# Patient Record
Sex: Male | Born: 1935 | Smoking: Never smoker
Health system: Southern US, Community
[De-identification: ages and names within clinical notes are randomized; demographics above are authoritative.]

## PROBLEM LIST (undated history)

## (undated) DIAGNOSIS — I10 Essential (primary) hypertension: Secondary | ICD-10-CM

## (undated) DIAGNOSIS — F32A Depression, unspecified: Secondary | ICD-10-CM

## (undated) DIAGNOSIS — F329 Major depressive disorder, single episode, unspecified: Secondary | ICD-10-CM

## (undated) DIAGNOSIS — M1990 Unspecified osteoarthritis, unspecified site: Secondary | ICD-10-CM

## (undated) DIAGNOSIS — F0390 Unspecified dementia without behavioral disturbance: Secondary | ICD-10-CM

## (undated) HISTORY — DX: Depression, unspecified: F32.A

## (undated) HISTORY — DX: Unspecified osteoarthritis, unspecified site: M19.90

## (undated) HISTORY — DX: Unspecified dementia, unspecified severity, without behavioral disturbance, psychotic disturbance, mood disturbance, and anxiety: F03.90

## (undated) HISTORY — DX: Essential (primary) hypertension: I10

---
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
## 2019-12-09 NOTE — Progress Notes (Signed)
May 6th, 2021 [facility] Palliative Care Consult Note Telephone: [phone]  Fax: [phone]  PATIENT NAME: [name] Sr. DOB: 02-04-1925 MRN: [id number]  [facility] [address] (move in date 12/10/20200) [address]  [place] [address]  PRIMARY CARE PROVIDER: Georges Lynch, NP ([facility])  REFERRING PROVIDER: Georges Lynch, NP  RESPONSIBLE PARTY:  [name] (Daughter) [email or web address] [phone] (Mobile). Troy, Schmidt (son/Alexandra [place]) [phone] [email or web address]   ASSESSMENT / RECOMMENDATIONS:  1. Advance Care Planning: A. Directives: DNR on facility chart and uploaded to [facility] EMR B. Goals of Care: keep happy and content where he is. Always wanted to have fun and be silly. Seems to be enjoying his life at [facility].  2. Cognitive / Functional status: A & O to self only. Non-ambulatory. Tends to become loud early evening but calms after evening meds. Followed by Troy Braun NP (Psych) for dementia (Aricept for cognition, Seroquel bid for related behaviors), mild depression (Celexa), and insomnia (Melatonin)1-2 transfer to wheelchair. Incontinent of bowel and bladder.   Dependent for hygiene and dressing. Clip alarm on wheelchair for safety. Falls out of wheelchair about q mo; last eve out of wheelchair d/t another resident pulling out his wheelchair.   Able to feed himself. Last weight 1 month earlier was 164.4lbs, a gain of 10.4lbs over the last 4 months. At a Height of 5'6" his BMI is 26.5kg/m2. Consumes 75-100% of 3 meals/d.  3. Family Supports: Widowed Licensed conveyancer). Served in the [employer]. I spoke with daughter [name] with visit updates.  4. Follow up Palliative Care Visit: in 1-2 months and prn.  I spent 60 minutes providing this consultation from 1-2pm to 10am. More than 50% of the time in this consultation was spent coordinating communication.   HISTORY OF PRESENT ILLNESS:   [name] Sr. is a 86 y.o. male with h/o dementia, DJD, and HTN.  Palliative Care was asked to help address goals of care.   CODE STATUS: DNR  PPS: 30% HOSPICE ELIGIBILITY/DIAGNOSIS: TBD  PAST MEDICAL HISTORY:  Past Medical History:  Diagnosis Date  . Dementia (HCC)   . Depression   . DJD (degenerative joint disease)   . HTN (hypertension)     SOCIAL HX:  Social History   Tobacco Use  . Smoking status: Not on file  Substance Use Topics  . Alcohol use: Not on file    ALLERGIES: Not on File   PERTINENT MEDICATIONS:  Outpatient Encounter Medications as of 12/10/2019  Medication Sig  . acetaminophen (TYLENOL) 500 MG tablet Take 500 mg by mouth every 6 (six) hours as needed.  . carvedilol (COREG) 6.25 MG tablet Take 6.25 mg by mouth 2 (two) times daily.  . Cholecalciferol (VITAMIN D-3) 125 MCG (5000 UT) TABS Take by mouth. 1 tab qd  . citalopram (CELEXA) 10 MG tablet Take 10 mg by mouth daily.  Marland Kitchen donepezil (ARICEPT) 10 MG tablet Take 10 mg by mouth at bedtime.  . hydrochlorothiazide (HYDRODIURIL) 12.5 MG tablet Take 12.5 mg by mouth daily.  Marland Kitchen ipratropium (ATROVENT) 0.03 % nasal spray Place 2 sprays into both nostrils every 12 (twelve) hours.  . melatonin 3 MG TABS tablet Take 3 mg by mouth at bedtime.  Marland Kitchen QUEtiapine (SEROQUEL) 25 MG tablet Take 25 mg by mouth at bedtime. 1/2 tab (12.5mg ) qam and 1 tab (25mg ) qhs  . ramipril (ALTACE) 10 MG capsule Take 10 mg by mouth daily.  . simvastatin (ZOCOR) 10 MG tablet  Take 10 mg by mouth daily.  . Skin Protectants, Misc. (BAZA PROTECT EX) Apply topically. Apply to buttocks bid  . tamsulosin (FLOMAX) 0.4 MG CAPS capsule Take 0.4 mg by mouth.  . vitamin B-12 (CYANOCOBALAMIN) 1000 MCG tablet Take 1,000 mcg by mouth daily.   No facility-administered encounter medications on file as of 12/10/2019.    PHYSICAL EXAM:   Slender, affable, oriented to self only. Conversant, but speech tangential and often off topic. Engaged and engaging.  Maintaining good eye-contact. When asked where he was, read off of a sign in the room some informational material. Cardiovascular: regular rate and rhythm Pulmonary: clear ant fields Abdomen: soft, nontender, + bowel sounds Extremities: bilateral LE edema (ted hose in place), knuckle joint deformities of arthritis Skin: no rashes Neurological: Weakness but otherwise non-focal  Troy Handler, NP

## 2019-12-10 ENCOUNTER — Encounter: Payer: Self-pay | Admitting: Internal Medicine

## 2019-12-10 ENCOUNTER — Non-Acute Institutional Stay: Payer: Medicare Other | Admitting: Internal Medicine

## 2019-12-10 ENCOUNTER — Other Ambulatory Visit: Payer: Self-pay

## 2019-12-10 VITALS — Ht 66.0 in | Wt 164.4 lb

## 2019-12-10 DIAGNOSIS — Z515 Encounter for palliative care: Secondary | ICD-10-CM

## 2019-12-10 DIAGNOSIS — Z7189 Other specified counseling: Secondary | ICD-10-CM

## 2020-02-07 ENCOUNTER — Non-Acute Institutional Stay: Payer: Medicare Other | Admitting: Internal Medicine

## 2020-02-07 ENCOUNTER — Other Ambulatory Visit: Payer: Self-pay

## 2020-02-07 ENCOUNTER — Encounter: Payer: Self-pay | Admitting: Internal Medicine

## 2020-02-07 DIAGNOSIS — Z515 Encounter for palliative care: Secondary | ICD-10-CM

## 2020-02-07 DIAGNOSIS — Z7189 Other specified counseling: Secondary | ICD-10-CM

## 2020-02-07 NOTE — Progress Notes (Addendum)
July 4th, 2021 [facility] Palliative Care Consult Note Telephone: [phone]  Fax: [phone]   PATIENT NAME: Troy STEFFENHAGEN Sr. DOB: Jan 18, 1925 MRN: [id number]  [facility] [address] (move in date 12/10/20200)  [address]  [place] [address]  PRIMARY CARE PROVIDER: Georges Lynch, NP ([facility])   REFERRING PROVIDER: Georges Lynch, NP   RESPONSIBLE PARTY:  Caprice Renshaw (Daughter) [email or web address] [phone] (Mobile). Carmel, Waddington (son/Alexandra [place]) [phone] [email or web address]   ASSESSMENT / RECOMMENDATIONS:  1. Advance Care Planning: A. Directives: DNR on facility chart and uploaded to [facility] EMR B. Goals of Care: Continues happy and content. keep happy and content where he is. Pleasant affect, family shares he always wanted to have fun and be silly. Seems to continue to be enjoying his life at [facility].   2. Cognitive / Functional status: A & O to self only. Non-ambulatory. Staff report continues shouting out in the evenings. Mornings okay though can be resistant to care. He receives a late morning dose of Seroquel, and a larger bedtime dose, which seems to help behavious. Followed by Clydie Braun NP (Psych) for dementia (Aricept for cognition, Seroquel bid for related behaviors), mild depression (Celexa), and insomnia (Melatonin).1-2 transfer to wheelchair. Incontinent of bowel and bladder.    -consider additional dose midafternoon (4pm) dose Melatonin or additional Seroquel 25 mg. Chiropodist will run this by the psych NP, who is rounding tomorrow.  Dependent for hygiene and dressing. Clip alarm on wheelchair for safety. No recent falls.    Able to feed himself. Staff report episode strong/strangled cough last week while eating, concerning for aspiration. Speech therapy consulted and recommended change to mechanical soft diet which he is tolerating. No problems with chocking on food since. No  cough, dyspnea, or fever concerning for aspiration. I was able to see him feed himself lunch independently. He had one strong clearing cough, otherwise without problems. Weight book currently unavailable, but staff report good appetite and no signs of weight loss.  Last weight I have for him 2 months earlier was 164.4lbs, which was a gain of 10.4lbs over the prior 4 months. At a Height of 5'6" his BMI was 26.5kg/m2. Consumes 75-100% of 3 meals/d.   3. Family Supports: Widowed Licensed conveyancer). Served in the [employer]. I left a message on the cell phone of daughter Rayfield Citizen with my contact information, and invited her to call me for visit updates.   4. Follow up Palliative Care Visit: in 1-2 months and prn.   I spent 60 minutes providing this consultation from 10am to 11am. More than 50% of the time in this consultation was spent coordinating communication.    HISTORY OF PRESENT ILLNESS:  Troy LIFORD Sr. is a 86 y.o. male with h/o dementia, DJD, and HTN.   This is a f/u Palliative Care visit from May 6th, 2021.    CODE STATUS: DNR   PPS: 30%  HOSPICE ELIGIBILITY/DIAGNOSIS: TBD PAST MEDICAL HISTORY:  Past Medical History:  Diagnosis Date  . Dementia (HCC)   . Depression   . DJD (degenerative joint disease)   . HTN (hypertension)     SOCIAL HX:  Social History   Tobacco Use  . Smoking status: Not on file  Substance Use Topics  . Alcohol use: Not on file    ALLERGIES: No Known Allergies   PERTINENT MEDICATIONS:  Outpatient Encounter Medications as of 02/07/2020  Medication Sig  . acetaminophen (TYLENOL) 500 MG tablet  Take 500 mg by mouth every 6 (six) hours as needed.  . carvedilol (COREG) 6.25 MG tablet Take 6.25 mg by mouth 2 (two) times daily.  . Cholecalciferol (VITAMIN D-3) 125 MCG (5000 UT) TABS Take by mouth. 1 tab qd  . citalopram (CELEXA) 10 MG tablet Take 10 mg by mouth daily.  Marland Kitchen donepezil (ARICEPT) 10 MG tablet Take 10 mg by mouth at bedtime.  . hydrochlorothiazide  (HYDRODIURIL) 12.5 MG tablet Take 12.5 mg by mouth daily.  Marland Kitchen ipratropium (ATROVENT) 0.03 % nasal spray Place 2 sprays into both nostrils every 12 (twelve) hours.  . melatonin 3 MG TABS tablet Take 3 mg by mouth at bedtime.  Marland Kitchen QUEtiapine (SEROQUEL) 25 MG tablet Take 25 mg by mouth at bedtime. 1/2 tab (12.5mg ) qam and 1 tab (25mg ) qhs  . ramipril (ALTACE) 10 MG capsule Take 10 mg by mouth daily.  . simvastatin (ZOCOR) 10 MG tablet Take 10 mg by mouth daily.  . Skin Protectants, Misc. (BAZA PROTECT EX) Apply topically. Apply to buttocks bid  . tamsulosin (FLOMAX) 0.4 MG CAPS capsule Take 0.4 mg by mouth.  . vitamin B-12 (CYANOCOBALAMIN) 1000 MCG tablet Take 1,000 mcg by mouth daily.   No facility-administered encounter medications on file as of 02/07/2020.    PHYSICAL EXAM:   99% Room Air, HR 86. RR 12 Slender, affable, oriented to self only. Conversant, without any concerns. Engaged and engaging. Able to maintain eyecontact Cardiovascular: regular rate and rhythm. Atrial gallop. Systolic murmur at apex. Pulmonary: clear posterior fields Abdomen: soft, nontender, + bowel sounds Extremities: bilateral LE edema (ted hose in place), knuckle joint deformities of arthritis Skin: no rashes Neurological: Weakness but otherwise non-focal. Wheelchair confined.  04/09/2020, NP

## 2020-04-27 ENCOUNTER — Other Ambulatory Visit: Payer: Self-pay

## 2020-04-27 ENCOUNTER — Non-Acute Institutional Stay: Payer: Medicare Other | Admitting: Nurse Practitioner

## 2020-04-27 DIAGNOSIS — Z515 Encounter for palliative care: Secondary | ICD-10-CM

## 2020-04-27 DIAGNOSIS — F0391 Unspecified dementia with behavioral disturbance: Secondary | ICD-10-CM

## 2020-04-27 NOTE — Progress Notes (Addendum)
[facility] Consult Note Telephone: [phone]  Fax: [phone]  Addendum 03/28/2020 Telephone call made to [place]. Daughter Chrys Racer expressed concern for patient's constant sleepiness. She expressed concern for possible over medication stating that patient medication was adjusted 3-4 months ago. She report patient used to be more interactive and participates in facility activities. She said in recent weeks, patient is always noted to be hunched-over in wheelchair sleeping and not have much of an interaction whenever she is visiting.  She also expressed concerns for patient regimen related to patient's pain management due osteoarthritis, she report that patient used to receive steroid injection to bilateral knee joint in the past. She asked if his pain medication can be scheduled instead of having it as needed, saying patient may not know ot ask for it due to his dementia. Discussed family concerns with [facility] facility resident care coordinator for memory care unit. Legrand Como said situation will be addressed by Psych NP and facility provider. Recommended scheduling patient on Tylenol 628m BID.    PATIENT NAME: Troy DAHLENSr. [address] [place] [phone](home)  DOB: 84-Oct-1926MRN: [id number] PRIMARY CARE PROVIDER:    NJacelyn Pi NP,  [address] [place] [phone] REFERRING PROVIDER:   [place]Jacelyn Pi [place]  [place] [phone]  RESPONSIBLE PARTY:  CLanna Poche(Daughter) [email or web address] [phone](Mobile). RCresencio, Reesor(son[place] [email or web address]     I met face to face with patient in facility.  ASSESSMENT AND RECOMMENDATIONS:   1. Advance Care Planning/Goals of Care:  Directives: DNR on facility chart and on [facility]. Goals of Care: Goals  of care is comfort, nothing aggressive. Per family, patient has always been healthy, family want him to be content, have some enjoyment if her can. Family do not want him in pain,  want him happy and content where he is. Family report that patient is easy going, and happy person used to like to have fun.  2. Symptom Management: Patient with advanced dementia with behavioral concerns, he currently resides in a locked unit of an assisted living. Patient behavior appeared controlled on current regimen of Seroquel 12.572mmorning and noon and 257mn the evenings. Patient alert and cooperative during visit today, no evidence of acute illness observed. Staff report good appetite, weight is stable. Palliative care will continue to provided support to patient, family and medical team. However  due to patient's complex disease issues patient is at increased risk for skin breakdown with infection, gradual weight loss, dysphagia, aspiration, vascular compromise leading to CVA, MI, death. These risk will remain and will progress over time due to advancing age. Will reach out to family to discuss visit findings.  3. Follow up Palliative Care Visit: Palliative care will continue to follow for goals of care clarification and symptom management. Return 8 weeks or prn.  4. Family /Caregiver/Community Supports: Served in the [employer] is widowed. Family very involved in his care.  5. Cognitive / Functional decline: Patient dependent on staff for all of his ADLs, able to feed self. Diet down graded to mechanical soft, tolerating diet. He is followed by mental health provider, Wheelchair dependent, 1-2 person assistance with transfer. Patient has clip alarm on wheelchair for safety.  I spent 48 minutes providing this consultation, time includes time spent with patient/family, chart review, provider coordination, and  documentation. More than 50% of the time in this consultation was spent coordinating communication.    HISTORY OF PRESENT ILLNESS:  Reino Lybbert. is a 86 y.o. year old male with multiple medical problems including dementia, DJD, and HTN. Palliative Care was asked to follow this patient by consultation request of Jacelyn Pi, NP to help address advance care planning and goals of care. This is a follow up visit, last visit was on 02/07/2020  CODE STATUS: DNR  PPS: 30%  HOSPICE ELIGIBILITY/DIAGNOSIS: TBD  PAST MEDICAL HISTORY:  Past Medical History:  Diagnosis Date   Dementia ([facility])    Depression    DJD (degenerative joint disease)    HTN (hypertension)     SOCIAL HX:  Social History   Tobacco Use   Smoking status: Not on file  Substance Use Topics   Alcohol use: Not on file   FAMILY HX: No family history on file.  ALLERGIES: No Known Allergies   PERTINENT MEDICATIONS:  Outpatient Encounter Medications as of 04/27/2020  Medication Sig   acetaminophen (TYLENOL) 500 MG tablet Take 500 mg by mouth every 6 (six) hours as needed.   carvedilol (COREG) 6.25 MG tablet Take 6.25 mg by mouth 2 (two) times daily.   Cholecalciferol (VITAMIN D-3) 125 MCG (5000 UT) TABS Take by mouth. 1 tab qd   citalopram (CELEXA) 10 MG tablet Take 10 mg by mouth daily.   donepezil (ARICEPT) 10 MG tablet Take 10 mg by mouth at bedtime.   hydrochlorothiazide (HYDRODIURIL) 12.5 MG tablet Take 12.5 mg by mouth daily.   ipratropium (ATROVENT) 0.03 % nasal spray Place 2 sprays into both nostrils every 12 (twelve) hours.   melatonin 3 MG TABS tablet Take 3 mg by mouth at bedtime.   QUEtiapine (SEROQUEL) 25 MG tablet Take 25 mg by mouth at bedtime. 1/2 tab (12.30m) qam and 1 tab (290m qhs   ramipril (ALTACE) 10 MG capsule Take 10 mg by mouth daily.   simvastatin (ZOCOR) 10 MG tablet Take 10 mg by mouth daily.   Skin Protectants, Misc. (BAZA PROTECT EX) Apply topically. Apply to buttocks bid   tamsulosin (FLOMAX) 0.4 MG CAPS capsule Take 0.4 mg by mouth.   vitamin B-12  (CYANOCOBALAMIN) 1000 MCG tablet Take 1,000 mcg by mouth daily.   No facility-administered encounter medications on file as of 04/27/2020.    PHYSICAL EXAM / ROS:   Current and past weights: 163.1lbs stable from last visit. Ht 30f2f, BMI 26.5kg/m2 General:  frail appearing, sitting in wheelchair watching TV in living area, NAD Cardiovascular: denied chest pain reported, S1S2 normal Pulmonary: no cough, no increased SOB, room air Abdomen: appetite fair, no report of constipation, incontinent of bowel GU: denies dysuria, incontinent of urine MSK:  no joint and ROM abnormalities, wheelchair dependent Skin: no rashes, broken nail to right fore-finger-site tender to touch, no drainage Neurological: Weakness, but otherwise non-focal   QueJari FavreNP, AGPCNP-BC

## 2020-07-10 ENCOUNTER — Non-Acute Institutional Stay: Payer: Medicare Other | Admitting: Nurse Practitioner

## 2020-07-10 ENCOUNTER — Other Ambulatory Visit: Payer: Self-pay

## 2020-07-10 DIAGNOSIS — G309 Alzheimer's disease, unspecified: Secondary | ICD-10-CM

## 2020-07-10 DIAGNOSIS — Z515 Encounter for palliative care: Secondary | ICD-10-CM

## 2020-07-10 DIAGNOSIS — F0281 Dementia in other diseases classified elsewhere with behavioral disturbance: Secondary | ICD-10-CM

## 2020-07-10 NOTE — Progress Notes (Signed)
[facility] Consult Note Telephone: [phone]  Fax: [phone]  PATIENT NAME: Troy MANGEL Sr. [address] [place] [address] [phone] (home)  DOB: [birth year] MRN: [id number]  PRIMARY CARE PROVIDER:    Jacelyn Pi, NP,  [address] [place] [address] [phone]  REFERRING PROVIDER:   Benedict Needy Valere Dross, [address] [place],  [place] [address] [phone]  RESPONSIBLE PARTY:   Extended Emergency Contact Information Primary Emergency Contact: Lanna Poche Mobile Phone: [phone] Relation: Daughter Secondary Emergency Contact: Zeke, Aker Mobile Phone: [phone] Relation: Son  I met face to face with patient in facility.   ASSESSMENT AND RECOMMENDATIONS:   Advance Care Planning: (Per prior palliative care visit note) Goal of care: Goals of care is comfort. Directives: Signed DNR form on chart in facility and on [facility] EMR  Symptom Management:  Weight loss: Patient with 3.5llb weight loss from last palliative care visit 2 months ago, BMI 25.8kg/m2. Patient does not appear acutely ill on exam today, no evidence of acute distress noted. Patient is a poor historian related to poor cognition secondary to advance dementia. Staff voiced no concerns for patient, report fair appetite. No report of recent fall or hospitalization, no report of uncontrolled pain or behavior. Recommendation: BMI within normal range. Continue to monitor weight per facility protocol, may consider dietary supplement like Ensure and /or appetite stimulant Remeron 7.66m if weight loss persist. Encourage snacking between meals.   Follow up Palliative Care Visit: Palliative care will continue to follow for goals of care clarification and symptom management. Return 6-8 weeks or prn.  Family /Caregiver/Community Supports: Patient Served in the [employer] Patient is  widowed. Family very involved in his care. Unable to reach family to discuss visit update, left voice message with my contact information to call me.  Cognitive / Functional decline: Patient dependent on staff for all of his ADLs, able to feed self. On mechanical soft, tolerating diet with no report of evidence of aspiration. He is followed by mental health provider. Patient is wheelchair dependent, 1-2 person assistance with transfers, has clip alarm on wheelchair for safety.  I spent 48 minutes providing this consultation, time includes time spent with patient, chart review, provider coordination, and documentation. More than 50% of the time in this consultation was spent counseling and coordinating communication.   CHIEF COMPLAINT: Follow up palliative care visit  HISTORY OF PRESENT ILLNESS:  Troy CHAVARINSr. is a 86y.o. year old male with multiple medical problems including dementia (FAST 7a), DJD, and HTN. Palliative Care was asked to help address advance care planning and goals of care.   CODE STATUS: DNR  PPS: 30%  HOSPICE ELIGIBILITY/DIAGNOSIS: TBD  PHYSICAL EXAM / ROS:   Current and past weights: 159.6lbs down from 163.1lbs from last visit, Ht 596f", BMI 25.8kg/m2 General: frail appearing, sitting in his wheelchair in NAD Cardiovascular: denied chest pain reported, S1S2 normal Pulmonary: no cough, no increased SOB, room air Abdomen: appetite fair, no report of constipation, incontinent of bowel GU: denies dysuria, incontinent of urine MSK:  no joint and ROM abnormalities, wheelchair dependent Skin: no rashes or wound reported or noted on exposed skin Neurological: weakness, but otherwise non-focal  PAST MEDICAL HISTORY:  Past Medical History:  Diagnosis Date  . Dementia ([facility]  . Depression   . DJD (degenerative joint disease)   . HTN (hypertension)  SOCIAL HX:  Social History   Tobacco Use  . Smoking status: Not on file  Substance Use Topics  . Alcohol  use: Not on file   FAMILY HX: No family history on file.  ALLERGIES: No Known Allergies   PERTINENT MEDICATIONS:  Outpatient Encounter Medications as of 07/10/2020  Medication Sig  . acetaminophen (TYLENOL) 500 MG tablet Take 500 mg by mouth every 6 (six) hours as needed.  . carvedilol (COREG) 6.25 MG tablet Take 6.25 mg by mouth 2 (two) times daily.  . Cholecalciferol (VITAMIN D-3) 125 MCG (5000 UT) TABS Take by mouth. 1 tab qd  . citalopram (CELEXA) 10 MG tablet Take 10 mg by mouth daily.  Marland Kitchen donepezil (ARICEPT) 10 MG tablet Take 10 mg by mouth at bedtime.  . hydrochlorothiazide (HYDRODIURIL) 12.5 MG tablet Take 12.5 mg by mouth daily.  Marland Kitchen ipratropium (ATROVENT) 0.03 % nasal spray Place 2 sprays into both nostrils every 12 (twelve) hours.  . melatonin 3 MG TABS tablet Take 3 mg by mouth at bedtime.  Marland Kitchen QUEtiapine (SEROQUEL) 25 MG tablet Take 25 mg by mouth at bedtime. 1/2 tab (12.59m) qam and 1 tab (273m qhs  . ramipril (ALTACE) 10 MG capsule Take 10 mg by mouth daily.  . simvastatin (ZOCOR) 10 MG tablet Take 10 mg by mouth daily.  . Skin Protectants, Misc. (BAZA PROTECT EX) Apply topically. Apply to buttocks bid  . tamsulosin (FLOMAX) 0.4 MG CAPS capsule Take 0.4 mg by mouth.  . vitamin B-12 (CYANOCOBALAMIN) 1000 MCG tablet Take 1,000 mcg by mouth daily.   No facility-administered encounter medications on file as of 07/10/2020.     QuAlba DestineNP , DNP, AGPCNP-BC

## 2020-11-19 ENCOUNTER — Inpatient Hospital Stay (HOSPITAL_COMMUNITY): Payer: Medicare Other

## 2020-11-19 ENCOUNTER — Emergency Department (HOSPITAL_COMMUNITY): Payer: Medicare Other

## 2020-11-19 ENCOUNTER — Inpatient Hospital Stay (HOSPITAL_COMMUNITY)
Admission: EM | Admit: 2020-11-19 | Discharge: 2020-11-27 | DRG: 684 | Disposition: A | Discharge status: Skilled Nursing Facility | Payer: Medicare Other | Source: Skilled Nursing Facility | Attending: Internal Medicine | Admitting: Internal Medicine

## 2020-11-19 DIAGNOSIS — Z79899 Other long term (current) drug therapy: Secondary | ICD-10-CM

## 2020-11-19 DIAGNOSIS — D638 Anemia in other chronic diseases classified elsewhere: Secondary | ICD-10-CM | POA: Diagnosis present

## 2020-11-19 DIAGNOSIS — I10 Essential (primary) hypertension: Secondary | ICD-10-CM | POA: Diagnosis present

## 2020-11-19 DIAGNOSIS — R338 Other retention of urine: Secondary | ICD-10-CM | POA: Diagnosis present

## 2020-11-19 DIAGNOSIS — K5900 Constipation, unspecified: Secondary | ICD-10-CM | POA: Diagnosis present

## 2020-11-19 DIAGNOSIS — F0280 Dementia in other diseases classified elsewhere without behavioral disturbance: Secondary | ICD-10-CM | POA: Diagnosis present

## 2020-11-19 DIAGNOSIS — F0390 Unspecified dementia without behavioral disturbance: Secondary | ICD-10-CM | POA: Diagnosis present

## 2020-11-19 DIAGNOSIS — R296 Repeated falls: Secondary | ICD-10-CM | POA: Diagnosis present

## 2020-11-19 DIAGNOSIS — R1310 Dysphagia, unspecified: Secondary | ICD-10-CM | POA: Diagnosis present

## 2020-11-19 DIAGNOSIS — R4182 Altered mental status, unspecified: Secondary | ICD-10-CM

## 2020-11-19 DIAGNOSIS — Z66 Do not resuscitate: Secondary | ICD-10-CM | POA: Diagnosis present

## 2020-11-19 DIAGNOSIS — R319 Hematuria, unspecified: Secondary | ICD-10-CM

## 2020-11-19 DIAGNOSIS — R197 Diarrhea, unspecified: Secondary | ICD-10-CM | POA: Diagnosis present

## 2020-11-19 DIAGNOSIS — Z993 Dependence on wheelchair: Secondary | ICD-10-CM | POA: Diagnosis not present

## 2020-11-19 DIAGNOSIS — E875 Hyperkalemia: Secondary | ICD-10-CM | POA: Diagnosis present

## 2020-11-19 DIAGNOSIS — E860 Dehydration: Secondary | ICD-10-CM | POA: Diagnosis present

## 2020-11-19 DIAGNOSIS — Z515 Encounter for palliative care: Secondary | ICD-10-CM | POA: Diagnosis not present

## 2020-11-19 DIAGNOSIS — D72829 Elevated white blood cell count, unspecified: Secondary | ICD-10-CM | POA: Diagnosis not present

## 2020-11-19 DIAGNOSIS — Z20822 Contact with and (suspected) exposure to covid-19: Secondary | ICD-10-CM | POA: Diagnosis present

## 2020-11-19 DIAGNOSIS — E876 Hypokalemia: Secondary | ICD-10-CM | POA: Diagnosis not present

## 2020-11-19 DIAGNOSIS — N401 Enlarged prostate with lower urinary tract symptoms: Secondary | ICD-10-CM | POA: Diagnosis present

## 2020-11-19 DIAGNOSIS — N179 Acute kidney failure, unspecified: Principal | ICD-10-CM | POA: Diagnosis present

## 2020-11-19 DIAGNOSIS — G309 Alzheimer's disease, unspecified: Secondary | ICD-10-CM | POA: Diagnosis present

## 2020-11-19 DIAGNOSIS — Z7189 Other specified counseling: Secondary | ICD-10-CM | POA: Diagnosis not present

## 2020-11-19 DIAGNOSIS — R633 Feeding difficulties, unspecified: Secondary | ICD-10-CM | POA: Diagnosis present

## 2020-11-19 DIAGNOSIS — R34 Anuria and oliguria: Secondary | ICD-10-CM | POA: Diagnosis present

## 2020-11-19 DIAGNOSIS — F32A Depression, unspecified: Secondary | ICD-10-CM | POA: Diagnosis present

## 2020-11-19 LAB — URINALYSIS, ROUTINE W REFLEX MICROSCOPIC
Bilirubin Urine: NEGATIVE
Glucose, UA: NEGATIVE mg/dL
Ketones, ur: NEGATIVE mg/dL
Nitrite: NEGATIVE
Protein, ur: NEGATIVE mg/dL
RBC / HPF: >50 RBC/hpf — ABNORMAL HIGH (ref 0–5)
Specific Gravity, Urine: 1.011 (ref 1.005–1.030)
pH: 5 (ref 5.0–8.0)

## 2020-11-19 LAB — COMPREHENSIVE METABOLIC PANEL
ALT: 7 U/L (ref 0–44)
AST: 26 U/L (ref 15–41)
Albumin: 3.5 g/dL (ref 3.5–5.0)
Alkaline Phosphatase: 45 U/L (ref 38–126)
Anion gap: 11 (ref 5–15)
BUN: 55 mg/dL — ABNORMAL HIGH (ref 8–23)
CO2: 22 mmol/L (ref 22–32)
Calcium: 8.6 mg/dL — ABNORMAL LOW (ref 8.9–10.3)
Chloride: 104 mmol/L (ref 98–111)
Creatinine, Ser: 3.82 mg/dL — ABNORMAL HIGH (ref 0.61–1.24)
GFR, Estimated: 14 mL/min — ABNORMAL LOW (ref >60)
Glucose, Bld: 127 mg/dL — ABNORMAL HIGH (ref 70–99)
Potassium: 5.6 mmol/L — ABNORMAL HIGH (ref 3.5–5.1)
Sodium: 137 mmol/L (ref 135–145)
Total Bilirubin: 1.9 mg/dL — ABNORMAL HIGH (ref 0.3–1.2)
Total Protein: 6.4 g/dL — ABNORMAL LOW (ref 6.5–8.1)

## 2020-11-19 LAB — CBC WITH DIFFERENTIAL/PLATELET
Abs Immature Granulocytes: 0.16 10*3/uL — ABNORMAL HIGH (ref 0.00–0.07)
Basophils Absolute: 0 10*3/uL (ref 0.0–0.1)
Basophils Relative: 0 %
Eosinophils Absolute: 0 10*3/uL (ref 0.0–0.5)
Eosinophils Relative: 0 %
HCT: 38.7 % — ABNORMAL LOW (ref 39.0–52.0)
Hemoglobin: 12.7 g/dL — ABNORMAL LOW (ref 13.0–17.0)
Immature Granulocytes: 1 %
Lymphocytes Relative: 4 %
Lymphs Abs: 0.9 10*3/uL (ref 0.7–4.0)
MCH: 31.3 pg (ref 26.0–34.0)
MCHC: 32.8 g/dL (ref 30.0–36.0)
MCV: 95.3 fL (ref 80.0–100.0)
Monocytes Absolute: 1.5 10*3/uL — ABNORMAL HIGH (ref 0.1–1.0)
Monocytes Relative: 8 %
Neutro Abs: 16.9 10*3/uL — ABNORMAL HIGH (ref 1.7–7.7)
Neutrophils Relative %: 87 %
Platelets: 160 10*3/uL (ref 150–400)
RBC: 4.06 MIL/uL — ABNORMAL LOW (ref 4.22–5.81)
RDW: 14.2 % (ref 11.5–15.5)
WBC: 19.4 10*3/uL — ABNORMAL HIGH (ref 4.0–10.5)
nRBC: 0 % (ref 0.0–0.2)

## 2020-11-19 LAB — RESP PANEL BY RT-PCR (FLU A&B, COVID) ARPGX2
Influenza A by PCR: NEGATIVE
Influenza B by PCR: NEGATIVE
SARS Coronavirus 2 by RT PCR: NEGATIVE

## 2020-11-19 LAB — TROPONIN I (HIGH SENSITIVITY): Troponin I (High Sensitivity): 61 ng/L — ABNORMAL HIGH (ref <18)

## 2020-11-19 LAB — CBG MONITORING, ED: Glucose-Capillary: 129 mg/dL — ABNORMAL HIGH (ref 70–99)

## 2020-11-19 LAB — LIPASE, BLOOD: Lipase: 25 U/L (ref 11–51)

## 2020-11-19 MED ORDER — SODIUM CHLORIDE 0.9 % IV BOLUS
1000.0000 mL | Freq: Once | INTRAVENOUS | Status: AC
Start: 2020-11-19 — End: 2020-11-19
  Administered 2020-11-19: 1000 mL via INTRAVENOUS

## 2020-11-19 MED ORDER — ONDANSETRON HCL 4 MG/2ML IJ SOLN
4.0000 mg | Freq: Four times a day (QID) | INTRAMUSCULAR | Status: DC | PRN
  Administered 2020-11-23: 4 mg via INTRAVENOUS
  Filled 2020-11-19: qty 2

## 2020-11-19 MED ORDER — SODIUM ZIRCONIUM CYCLOSILICATE 10 G PO PACK
10.0000 g | PACK | Freq: Once | ORAL | Status: AC
  Administered 2020-11-19: 10 g via ORAL
  Filled 2020-11-19: qty 1

## 2020-11-19 MED ORDER — ACETAMINOPHEN 650 MG RE SUPP
650.0000 mg | Freq: Four times a day (QID) | RECTAL | Status: DC | PRN
  Administered 2020-11-23 – 2020-11-24 (×3): 650 mg via RECTAL
  Filled 2020-11-19 (×4): qty 1

## 2020-11-19 MED ORDER — ACETAMINOPHEN 325 MG PO TABS
650.0000 mg | ORAL_TABLET | Freq: Four times a day (QID) | ORAL | Status: DC | PRN
  Administered 2020-11-21 – 2020-11-26 (×4): 650 mg via ORAL
  Filled 2020-11-19 (×4): qty 2

## 2020-11-19 MED ORDER — DEXTROSE-NACL 5-0.9 % IV SOLN: INTRAVENOUS | Status: DC

## 2020-11-19 MED ORDER — ONDANSETRON HCL 4 MG PO TABS: 4.0000 mg | ORAL_TABLET | Freq: Four times a day (QID) | ORAL | Status: DC | PRN

## 2020-11-19 MED ORDER — ENOXAPARIN SODIUM 30 MG/0.3ML ~~LOC~~ SOLN
30.0000 mg | SUBCUTANEOUS | Status: DC
  Administered 2020-11-19: 30 mg via SUBCUTANEOUS
  Filled 2020-11-19: qty 0.3

## 2020-11-19 NOTE — ED Notes (Signed)
Xray at the bedside.

## 2020-11-19 NOTE — H&P (Signed)
History and Physical   Rise PaganiniRussell B Yan Schmidt. ZOX:[id number] DOB: 11/21/1924 DOA: 11/19/2020  Referring MD/NP/PA: Dr. Charm BargesButler  PCP: Sunday SpillersNguyen, Hong Thu T, NP   Outpatient Specialists: None  Patient coming from: [facility] Unit  Chief Complaint: Altered mental status  HPI: Troy RosierRussell B Geisel Schmidt. is a 87 y.o. male with medical history significant of advanced Alzheimer's dementia who lives in the memory unit, history of depression, degenerative disc disease, hypertension who was brought in today by his daughter secondary to worsening mental status.  Patient has been wheelchair-bound mainly.  He is however pleasant even though confused.  Daughter was worried that he has been much more worsening.  He was seen in the ER and evaluated.  Found to be severely dehydrated with AKI.  Also not passing urine.  There is question of possible aspiration and since then patient has stopped eating.  At this point patient is being admitted for further evaluation and treatment..  ED Course: Temperature 98.6 blood pressure 186/120 pulse 91 respiratory rate of 24 and oxygen sat 87% on room air.  COVID-19 is negative.  Urinalysis essentially in negative.  Sodium 137, potassium 5.6 chloride 104 CO2 22 and glucose 127.  BUN 55 creatinine 3.82 calcium 8.6.  Total bilirubin 1.9.  White count 19.5 hemoglobin 12.7.  Head CT without contrast shows no acute findings.  Chest x-ray showed no acute findings.  Review of Systems: As per HPI otherwise 10 point review of systems negative.    Past Medical History:  Diagnosis Date  . Dementia (HCC)   . Depression   . DJD (degenerative joint disease)   . HTN (hypertension)     No past surgical history on file.   has no history on file for tobacco use, alcohol use, and drug use.  No Known Allergies  No family history on file.   Prior to Admission medications   Medication Sig Start Date End Date Taking? Authorizing Provider  acetaminophen (TYLENOL) 500 MG tablet Take  500 mg by mouth every 6 (six) hours as needed.    [provider]  carvedilol (COREG) 6.25 MG tablet Take 6.25 mg by mouth 2 (two) times daily.    [provider]  Cholecalciferol (VITAMIN D-3) 125 MCG (5000 UT) TABS Take by mouth. 1 tab qd    [provider]  citalopram (CELEXA) 10 MG tablet Take 10 mg by mouth daily.    [provider]  donepezil (ARICEPT) 10 MG tablet Take 10 mg by mouth at bedtime.    [provider]  hydrochlorothiazide (HYDRODIURIL) 12.5 MG tablet Take 12.5 mg by mouth daily.    [provider]  ipratropium (ATROVENT) 0.03 % nasal spray Place 2 sprays into both nostrils every 12 (twelve) hours.    [provider]  melatonin 3 MG TABS tablet Take 3 mg by mouth at bedtime.    [provider]  QUEtiapine (SEROQUEL) 25 MG tablet Take 25 mg by mouth at bedtime. 1/2 tab (12.5mg ) qam and 1 tab (25mg ) qhs    [provider]  ramipril (ALTACE) 10 MG capsule Take 10 mg by mouth daily.    [provider]  simvastatin (ZOCOR) 10 MG tablet Take 10 mg by mouth daily.    [provider]  Skin Protectants, Misc. (BAZA PROTECT EX) Apply topically. Apply to buttocks bid    [provider]  tamsulosin (FLOMAX) 0.4 MG CAPS capsule Take 0.4 mg by mouth.    [provider]  vitamin  B-12 (CYANOCOBALAMIN) 1000 MCG tablet Take 1,000 mcg by mouth daily.    [provider]    Physical Exam: Vitals:   11/19/20 1846 11/19/20 1847 11/19/20 1956 11/19/20 2030  BP:  (!) 149/121 (!) 186/120 (!) 170/138  Pulse:  91 89 87  Resp:   (!) 23 (!) 24  Temp:  98.6 F (37 C)    SpO2:  95% 97% 97%  Weight: 74.6 kg     Height: 5\' 6"  (1.676 m)         Constitutional: Pleasantly confused, no agitation Vitals:   11/19/20 1846 11/19/20 1847 11/19/20 1956 11/19/20 2030  BP:  (!) 149/121 (!) 186/120 (!) 170/138  Pulse:  91 89 87  Resp:   (!) 23 (!) 24  Temp:  98.6 F (37 C)     SpO2:  95% 97% 97%  Weight: 74.6 kg     Height: 5\' 6"  (1.676 m)      Eyes: PERRL, lids and conjunctivae normal ENMT: Mucous membranes are dry. Posterior pharynx clear of any exudate or lesions.Normal dentition.  Neck: normal, supple, no masses, no thyromegaly Respiratory: clear to auscultation bilaterally, no wheezing, no crackles. Normal respiratory effort. No accessory muscle use.  Cardiovascular: Sinus tachycardia, no murmurs / rubs / gallops. No extremity edema. 2+ pedal pulses. No carotid bruits.  Abdomen: no tenderness, no masses palpated. No hepatosplenomegaly. Bowel sounds positive.  Musculoskeletal: no clubbing / cyanosis. No joint deformity upper and lower extremities. Good ROM, no contractures. Normal muscle tone.  Skin: no rashes, lesions, ulcers. No induration Neurologic: CN 2-12 grossly intact. Sensation intact, DTR normal. Strength 5/5 in all 4.  Psychiatric: Confused, no agitation, disoriented x3    Labs on Admission: I have personally reviewed following labs and imaging studies  CBC: Recent Labs  Lab 11/19/20 1857  WBC 19.4*  NEUTROABS 16.9*  HGB 12.7*  HCT 38.7*  MCV 95.3  PLT 160   Basic Metabolic Panel: Recent Labs  Lab 11/19/20 1857  NA 137  K 5.6*  CL 104  CO2 22  GLUCOSE 127*  BUN 55*  CREATININE 3.82*  CALCIUM 8.6*   GFR: Estimated Creatinine Clearance: 10.9 mL/min (A) (by C-G formula based on SCr of 3.82 mg/dL (H)). Liver Function Tests: Recent Labs  Lab 11/19/20 1857  AST 26  ALT 7  ALKPHOS 45  BILITOT 1.9*  PROT 6.4*  ALBUMIN 3.5   Recent Labs  Lab 11/19/20 1857  LIPASE 25   No results for input(s): AMMONIA in the last 168 hours. Coagulation Profile: No results for input(s): INR, PROTIME in the last 168 hours. Cardiac Enzymes: No results for input(s): CKTOTAL, CKMB, CKMBINDEX, TROPONINI in the last 168 hours. BNP (last 3 results) No results for input(s): PROBNP in the last 8760 hours. HbA1C: No results for input(s):  HGBA1C in the last 72 hours. CBG: Recent Labs  Lab 11/19/20 1943  GLUCAP 129*   Lipid Profile: No results for input(s): CHOL, HDL, LDLCALC, TRIG, CHOLHDL, LDLDIRECT in the last 72 hours. Thyroid Function Tests: No results for input(s): TSH, T4TOTAL, FREET4, T3FREE, THYROIDAB in the last 72 hours. Anemia Panel: No results for input(s): VITAMINB12, FOLATE, FERRITIN, TIBC, IRON, RETICCTPCT in the last 72 hours. Urine analysis: No results found for: COLORURINE, APPEARANCEUR, LABSPEC, PHURINE, GLUCOSEU, HGBUR, BILIRUBINUR, KETONESUR, PROTEINUR, UROBILINOGEN, NITRITE, LEUKOCYTESUR Sepsis Labs: @LABRCNTIP (procalcitonin:4,lacticidven:4) ) Recent Results (from the past 240 hour(s))  Resp Panel by RT-PCR (Flu A&B, Covid) Nasopharyngeal Swab     Status: None   Collection Time:  11/19/20  8:22 PM   Specimen: Nasopharyngeal Swab; Nasopharyngeal(NP) swabs in vial transport medium  Result Value Ref Range Status   SARS Coronavirus 2 by RT PCR NEGATIVE NEGATIVE Final    Comment: (NOTE) SARS-CoV-2 target nucleic acids are NOT DETECTED.  The SARS-CoV-2 RNA is generally detectable in upper respiratory specimens during the acute phase of infection. The lowest concentration of SARS-CoV-2 viral copies this assay can detect is 138 copies/mL. A negative result does not preclude SARS-Cov-2 infection and should not be used as the sole basis for treatment or other patient management decisions. A negative result may occur with  improper specimen collection/handling, submission of specimen other than nasopharyngeal swab, presence of viral mutation(s) within the areas targeted by this assay, and inadequate number of viral copies(<138 copies/mL). A negative result must be combined with clinical observations, patient history, and epidemiological information. The expected result is Negative.  Fact Sheet for Patients:  BloggerCourse.com  Fact Sheet for Healthcare Providers:   SeriousBroker.it  This test is no t yet approved or cleared by the [place] FDA and  has been authorized for detection and/or diagnosis of SARS-CoV-2 by FDA under an Emergency Use Authorization (EUA). This EUA will remain  in effect (meaning this test can be used) for the duration of the COVID-19 declaration under Section 564(b)(1) of the Act, 21 U.S.C.section 360bbb-3(b)(1), unless the authorization is terminated  or revoked sooner.       Influenza A by PCR NEGATIVE NEGATIVE Final   Influenza B by PCR NEGATIVE NEGATIVE Final    Comment: (NOTE) The Xpert Xpress SARS-CoV-2/FLU/RSV plus assay is intended as an aid in the diagnosis of influenza from Nasopharyngeal swab specimens and should not be used as a sole basis for treatment. Nasal washings and aspirates are unacceptable for Xpert Xpress SARS-CoV-2/FLU/RSV testing.  Fact Sheet for Patients: BloggerCourse.com  Fact Sheet for Healthcare Providers: SeriousBroker.it  This test is not yet approved or cleared by the [place] FDA and has been authorized for detection and/or diagnosis of SARS-CoV-2 by FDA under an Emergency Use Authorization (EUA). This EUA will remain in effect (meaning this test can be used) for the duration of the COVID-19 declaration under Section 564(b)(1) of the Act, 21 U.S.C. section 360bbb-3(b)(1), unless the authorization is terminated or revoked.  Performed at [facility], [address], [place], [place] [address]      Radiological Exams on Admission: CT Head Wo Contrast  Result Date: 11/19/2020 CLINICAL DATA:  Altered mental status EXAM: CT HEAD WITHOUT CONTRAST TECHNIQUE: Contiguous axial images were obtained from the base of the skull through the vertex without intravenous contrast. COMPARISON:  None. FINDINGS: Brain: No evidence of acute infarction, hemorrhage, hydrocephalus, extra-axial  collection or mass lesion/mass effect. Chronic atrophic and ischemic changes are noted. Vascular: No hyperdense vessel or unexpected calcification. Skull: Normal. Negative for fracture or focal lesion. Sinuses/Orbits: No acute finding. Other: None IMPRESSION: Chronic atrophic and ischemic changes are noted. Electronically Signed   By: Alcide Clever M.D.   On: 11/19/2020 22:04   DG Chest Port 1 View  Result Date: 11/19/2020 CLINICAL DATA:  Lethargy EXAM: PORTABLE CHEST 1 VIEW COMPARISON:  01/28/2019 FINDINGS: Lung volumes are small, but are symmetric and are clear. The chin overlies the lung apices, however, no large pneumothorax is identified. No pleural effusion. Cardiac size is within normal limits when accounting for poor pulmonary insufflation. Tortuosity of the thoracic aorta is likely accentuated by poor insufflation. The pulmonary vascularity is normal. IMPRESSION: Pulmonary hypoinflation.  Electronically Signed   By: Helyn Numbers MD   On: 11/19/2020 19:52    EKG: Independently reviewed.  Sinus rhythm with a rate of 82, no significant ST changes  Assessment/Plan Principal Problem:   AKI (acute kidney injury) (HCC) Active Problems:   Dementia (HCC)   Essential hypertension   Hyperkalemia   Leucocytosis   Dehydration     #1  Acute kidney injury: Most likely secondary to dehydration.  Patient has poor oral intake.  Continue oral intake.  Patient was also being aggressively hydrated with IV fluids.  Continue with close monitoring of creatinine and other vitals.  If and when patient's symptoms improve we will transition patient we will get him back to his facility.  #2 Altheimer's dementia: Pleasantly confused.  #3 hyperkalemia: Continue to monitor and correct most likely this is due to AKI.  #4 dehydration: Aggressively hydrate as per #1  #5 essential hypertension: Avoid diuretics.  Resume blood pressure medications when necessary.  Avoid nephrotoxic drugs  #6 leukocytosis: May be  demand related.   DVT prophylaxis: Lovenox renal dose Code Status: DNR Family Communication: Daughter at bedside Disposition Plan: Back to skilled facility Consults called: None Admission status: Inpatient  Severity of Illness: The appropriate patient status for this patient is INPATIENT. Inpatient status is judged to be reasonable and necessary in order to provide the required intensity of service to ensure the patient's safety. The patient's presenting symptoms, physical exam findings, and initial radiographic and laboratory data in the context of their chronic comorbidities is felt to place them at high risk for further clinical deterioration. Furthermore, it is not anticipated that the patient will be medically stable for discharge from the hospital within 2 midnights of admission. The following factors support the patient status of inpatient.   " The patient's presenting symptoms include altered mental status. " The worrisome physical exam findings include dry mucous membranes. " The initial radiographic and laboratory data are worrisome because of creatinine of 3.89. " The chronic co-morbidities include dementia.   * I certify that at the point of admission it is my clinical judgment that the patient will require inpatient hospital care spanning beyond 2 midnights from the point of admission due to high intensity of service, high risk for further deterioration and high frequency of surveillance required.Lonia Blood MD [facility] Pager [phone]  If 7PM-7AM, please contact night-coverage [email or web address] Password [facility]  11/19/2020, 10:26 PM

## 2020-11-19 NOTE — ED Notes (Signed)
Patient was flexing his arm during blood pressure reading at 20:04 hrs

## 2020-11-19 NOTE — ED Notes (Signed)
ED TO INPATIENT HANDOFF REPORT  Name/Age/Gender Troy Rosier Sr. 87 y.o. male  Code Status   Home/SNF/Other snf [facility]   Chief Complaint AKI (acute kidney injury) (HCC) [N17.9]  Level of Care/Admitting Diagnosis ED Disposition    ED Disposition Condition Comment   Admit  Hospital Area: [facility] [[id number]]  Level of Care: Med-Surg [16]  May admit patient to [facility] or [facility] if equivalent level of care is available:: Yes  Covid Evaluation: Asymptomatic Screening Protocol (No Symptoms)  Diagnosis: AKI (acute kidney injury) [facility]) [[id number]]  Admitting Physician: Rometta Emery [[id number]]  Attending Physician: Rometta Emery [[id number]]  Estimated length of stay: past midnight tomorrow  Certification:: I certify this patient will need inpatient services for at least 2 midnights       Medical History Past Medical History:  Diagnosis Date  . Dementia (HCC)   . Depression   . DJD (degenerative joint disease)   . HTN (hypertension)     Allergies No Known Allergies  IV Location/Drains/Wounds Patient Lines/Drains/Airways Status    Active Line/Drains/Airways    Name Placement date Placement time Site Days   Peripheral IV 11/19/20 Distal;Left;Posterior Forearm 11/19/20  1830  Forearm  less than 1          Labs/Imaging Results for orders placed or performed during the hospital encounter of 11/19/20 (from the past 48 hour(s))  CBC with Differential     Status: Abnormal   Collection Time: 11/19/20  6:57 PM  Result Value Ref Range   WBC 19.4 (H) 4.0 - 10.5 K/uL   RBC 4.06 (L) 4.22 - 5.81 MIL/uL   Hemoglobin 12.7 (L) 13.0 - 17.0 g/dL   HCT 06.2 (L) 37.6 - 28.3 %   MCV 95.3 80.0 - 100.0 fL   MCH 31.3 26.0 - 34.0 pg   MCHC 32.8 30.0 - 36.0 g/dL   RDW 15.1 76.1 - 60.7 %   Platelets 160 150 - 400 K/uL   nRBC 0.0 0.0 - 0.2 %   Neutrophils Relative % 87 %   Neutro Abs 16.9 (H) 1.7 - 7.7 K/uL   Lymphocytes Relative 4 %    Lymphs Abs 0.9 0.7 - 4.0 K/uL   Monocytes Relative 8 %   Monocytes Absolute 1.5 (H) 0.1 - 1.0 K/uL   Eosinophils Relative 0 %   Eosinophils Absolute 0.0 0.0 - 0.5 K/uL   Basophils Relative 0 %   Basophils Absolute 0.0 0.0 - 0.1 K/uL   Immature Granulocytes 1 %   Abs Immature Granulocytes 0.16 (H) 0.00 - 0.07 K/uL    Comment: Performed at [facility], [address] [place], [place] [address]  Comprehensive metabolic panel     Status: Abnormal   Collection Time: 11/19/20  6:57 PM  Result Value Ref Range   Sodium 137 135 - 145 mmol/L   Potassium 5.6 (H) 3.5 - 5.1 mmol/L   Chloride 104 98 - 111 mmol/L   CO2 22 22 - 32 mmol/L   Glucose, Bld 127 (H) 70 - 99 mg/dL    Comment: Glucose reference range applies only to samples taken after fasting for at least 8 hours.   BUN 55 (H) 8 - 23 mg/dL   Creatinine, Ser 2.69 (H) 0.61 - 1.24 mg/dL   Calcium 8.6 (L) 8.9 - 10.3 mg/dL   Total Protein 6.4 (L) 6.5 - 8.1 g/dL   Albumin 3.5 3.5 - 5.0 g/dL   AST 26 15 -  41 U/L   ALT 7 0 - 44 U/L   Alkaline Phosphatase 45 38 - 126 U/L   Total Bilirubin 1.9 (H) 0.3 - 1.2 mg/dL   GFR, Estimated 14 (L) >60 mL/min    Comment: (NOTE) Calculated using the CKD-EPI Creatinine Equation (2021)    Anion gap 11 5 - 15    Comment: Performed at [facility], [address] [place], [place] [address]  Lipase, blood     Status: None   Collection Time: 11/19/20  6:57 PM  Result Value Ref Range   Lipase 25 11 - 51 U/L    Comment: Performed at [facility], [address], [place], [place] [address]  Troponin I (High Sensitivity)     Status: Abnormal   Collection Time: 11/19/20  6:57 PM  Result Value Ref Range   Troponin I (High Sensitivity) 61 (H) <18 ng/L    Comment: (NOTE) Elevated high sensitivity troponin I (hsTnI) values and significant  changes across serial measurements may suggest ACS but many other  chronic and acute conditions are known to elevate  hsTnI results.  Refer to the "Links" section for chest pain algorithms and additional  guidance. Performed at [facility], [address], [place], [place] [address]   CBG monitoring, ED     Status: Abnormal   Collection Time: 11/19/20  7:43 PM  Result Value Ref Range   Glucose-Capillary 129 (H) 70 - 99 mg/dL    Comment: Glucose reference range applies only to samples taken after fasting for at least 8 hours.  Resp Panel by RT-PCR (Flu A&B, Covid) Nasopharyngeal Swab     Status: None   Collection Time: 11/19/20  8:22 PM   Specimen: Nasopharyngeal Swab; Nasopharyngeal(NP) swabs in vial transport medium  Result Value Ref Range   SARS Coronavirus 2 by RT PCR NEGATIVE NEGATIVE    Comment: (NOTE) SARS-CoV-2 target nucleic acids are NOT DETECTED.  The SARS-CoV-2 RNA is generally detectable in upper respiratory specimens during the acute phase of infection. The lowest concentration of SARS-CoV-2 viral copies this assay can detect is 138 copies/mL. A negative result does not preclude SARS-Cov-2 infection and should not be used as the sole basis for treatment or other patient management decisions. A negative result may occur with  improper specimen collection/handling, submission of specimen other than nasopharyngeal swab, presence of viral mutation(s) within the areas targeted by this assay, and inadequate number of viral copies(<138 copies/mL). A negative result must be combined with clinical observations, patient history, and epidemiological information. The expected result is Negative.  Fact Sheet for Patients:  BloggerCourse.com  Fact Sheet for Healthcare Providers:  SeriousBroker.it  This test is no t yet approved or cleared by the [place] FDA and  has been authorized for detection and/or diagnosis of SARS-CoV-2 by FDA under an Emergency Use Authorization (EUA). This EUA will remain  in effect (meaning  this test can be used) for the duration of the COVID-19 declaration under Section 564(b)(1) of the Act, 21 U.S.C.section 360bbb-3(b)(1), unless the authorization is terminated  or revoked sooner.       Influenza A by PCR NEGATIVE NEGATIVE   Influenza B by PCR NEGATIVE NEGATIVE    Comment: (NOTE) The Xpert Xpress SARS-CoV-2/FLU/RSV plus assay is intended as an aid in the diagnosis of influenza from Nasopharyngeal swab specimens and should not be used as a sole basis for treatment. Nasal washings and aspirates are unacceptable for Xpert Xpress SARS-CoV-2/FLU/RSV testing.  Fact  Sheet for Patients: BloggerCourse.com  Fact Sheet for Healthcare Providers: SeriousBroker.it  This test is not yet approved or cleared by the [place] FDA and has been authorized for detection and/or diagnosis of SARS-CoV-2 by FDA under an Emergency Use Authorization (EUA). This EUA will remain in effect (meaning this test can be used) for the duration of the COVID-19 declaration under Section 564(b)(1) of the Act, 21 U.S.C. section 360bbb-3(b)(1), unless the authorization is terminated or revoked.  Performed at [facility], [address], [place], [place] [address]    CT Head Wo Contrast  Result Date: 11/19/2020 CLINICAL DATA:  Altered mental status EXAM: CT HEAD WITHOUT CONTRAST TECHNIQUE: Contiguous axial images were obtained from the base of the skull through the vertex without intravenous contrast. COMPARISON:  None. FINDINGS: Brain: No evidence of acute infarction, hemorrhage, hydrocephalus, extra-axial collection or mass lesion/mass effect. Chronic atrophic and ischemic changes are noted. Vascular: No hyperdense vessel or unexpected calcification. Skull: Normal. Negative for fracture or focal lesion. Sinuses/Orbits: No acute finding. Other: None IMPRESSION: Chronic atrophic and ischemic changes are noted. Electronically Signed    By: Alcide Clever M.D.   On: 11/19/2020 22:04   DG Chest Port 1 View  Result Date: 11/19/2020 CLINICAL DATA:  Lethargy EXAM: PORTABLE CHEST 1 VIEW COMPARISON:  01/28/2019 FINDINGS: Lung volumes are small, but are symmetric and are clear. The chin overlies the lung apices, however, no large pneumothorax is identified. No pleural effusion. Cardiac size is within normal limits when accounting for poor pulmonary insufflation. Tortuosity of the thoracic aorta is likely accentuated by poor insufflation. The pulmonary vascularity is normal. IMPRESSION: Pulmonary hypoinflation. Electronically Signed   By: Helyn Numbers MD   On: 11/19/2020 19:52    Pending Labs Unresulted Labs (From admission, onward)          Start     Ordered   11/19/20 1857  Urinalysis, Routine w reflex microscopic Urine, Catheterized  (ED ALOC)  Once,   STAT        11/19/20 1857   11/19/20 1857  Ammonia  (ED ALOC)  Once,   STAT        11/19/20 1857          Vitals/Pain Today's Vitals   11/19/20 1846 11/19/20 1847 11/19/20 1956 11/19/20 2030  BP:  (!) 149/121 (!) 186/120 (!) 170/138  Pulse:  91 89 87  Resp:   (!) 23 (!) 24  Temp:  98.6 F (37 C)    SpO2:  95% 97% 97%  Weight: 74.6 kg     Height: 5\' 6"  (1.676 m)       Isolation Precautions Airborne and Contact precautions  Medications Medications  sodium chloride 0.9 % bolus 1,000 mL (0 mLs Intravenous Stopped 11/19/20 2032)  sodium zirconium cyclosilicate (LOKELMA) packet 10 g (10 g Oral Given 11/19/20 2140)    Mobility Wheelchair

## 2020-11-19 NOTE — ED Triage Notes (Signed)
Pt presents via EMS from [facility] memory care unit for possible aspiration and increased lethargy. Per EMS, pt was found supine in his bed with vomit on himself. EMS states the nursing facility is worried for "possible aspiration." The pt has been "lerthagic, has not had much of an appetite, and has not taken his medications today." Pt was 92% on room air on EMS's arrival but pt does not normally wear O2. Pt has a known hx of dementia.

## 2020-11-19 NOTE — ED Notes (Signed)
Pt placed on 2 L nasal cannula for O2 dropping into low 90s, high 80s.

## 2020-11-19 NOTE — ED Provider Notes (Signed)
[facility]-EMERGENCY DEPT Provider Note   CSN: [id number] Arrival date & time: 11/19/20  1833     History Chief Complaint  Patient presents with  . Altered Mental Status    Troy MCCARD Sr. is a 87 y.o. male.  Level 5 caveat secondary to dementia and altered mental status.  Patient is a resident at [facility] care unit.  He was noted by staff to be.  Patient covered in vomit this morning.  Question aspiration.  Since then he has not been taking his meals and has been refusing his meds.  More lethargic than normal.  Daughter is here now and is confirming that part of the history.  She said he sometimes is able to interact.  Usually is in his wheelchair all day and is active and does not typically go back to bed like he did today.  The history is provided by the EMS personnel and a relative. The history is limited by the condition of the patient.  Altered Mental Status Presenting symptoms: lethargy   Severity:  Moderate Most recent episode:  Today Episode history:  Single Timing:  Constant Progression:  Unchanged Chronicity:  New Context: dementia   Associated symptoms: vomiting   Associated symptoms: no abdominal pain        Past Medical History:  Diagnosis Date  . Dementia (HCC)   . Depression   . DJD (degenerative joint disease)   . HTN (hypertension)     Patient Active Problem List   Diagnosis Date Noted  . AKI (acute kidney injury) (HCC) 11/19/2020  . Dementia (HCC) 11/19/2020  . Essential hypertension 11/19/2020  . Hyperkalemia 11/19/2020  . Leucocytosis 11/19/2020  . Dehydration 11/19/2020    History reviewed. No pertinent surgical history.     History reviewed. No pertinent family history.     Home Medications Prior to Admission medications   Medication Sig Start Date End Date Taking? Authorizing Provider  acetaminophen (TYLENOL) 500 MG tablet Take 500 mg by mouth every 6 (six) hours as needed.    [provider]  carvedilol (COREG) 6.25 MG tablet Take 6.25 mg by mouth 2 (two) times daily.    [provider]  Cholecalciferol (VITAMIN D-3) 125 MCG (5000 UT) TABS Take by mouth. 1 tab qd    [provider]  citalopram (CELEXA) 10 MG tablet Take 10 mg by mouth daily.    [provider]  donepezil (ARICEPT) 10 MG tablet Take 10 mg by mouth at bedtime.    [provider]  hydrochlorothiazide (HYDRODIURIL) 12.5 MG tablet Take 12.5 mg by mouth daily.    [provider]  ipratropium (ATROVENT) 0.03 % nasal spray Place 2 sprays into both nostrils every 12 (twelve) hours.    [provider]  melatonin 3 MG TABS tablet Take 3 mg by mouth at bedtime.    [provider]  QUEtiapine (SEROQUEL) 25 MG tablet Take 25 mg by mouth at bedtime. 1/2 tab (12.5mg ) qam and 1 tab ( ) qhs    [provider]  ramipril (ALTACE) 10 MG capsule Take 10 mg by mouth daily.    [provider]  simvastatin (ZOCOR) 10 MG tablet Take 10 mg by mouth daily.    [provider]  Skin Protectants, Misc. (BAZA PROTECT EX) Apply topically. Apply to buttocks bid    [provider]  tamsulosin (FLOMAX) 0.4 MG CAPS capsule Take 0.4 mg by mouth.    [provider]  vitamin  B-12 (CYANOCOBALAMIN) 1000 MCG tablet Take 1,000 mcg by mouth daily.    [provider]    Allergies    Patient has no known allergies.  Review of Systems   Review of Systems  Unable to perform ROS: Dementia  Gastrointestinal: Positive for vomiting. Negative for abdominal pain.    Physical Exam Updated Vital Signs BP (!) 149/121 (BP Location: Left Arm)   Pulse 91   Temp 98.6 F (37 C)   Ht 5\' 6"  (1.676 m)   Wt 74.6 kg   SpO2 95%   BMI 26.55 kg/m   Physical Exam Vitals and nursing note reviewed.  Constitutional:      General: He is not in acute distress.    Appearance: Normal appearance. He is well-developed.  HENT:     Head:  Normocephalic and atraumatic.  Eyes:     Conjunctiva/sclera: Conjunctivae normal.  Cardiovascular:     Rate and Rhythm: Normal rate and regular rhythm.     Pulses: Normal pulses.     Heart sounds: No murmur heard.   Pulmonary:     Effort: Pulmonary effort is normal. No respiratory distress.     Breath sounds: Normal breath sounds.  Abdominal:     Palpations: Abdomen is soft.     Tenderness: There is no abdominal tenderness. There is no guarding or rebound.  Musculoskeletal:     Cervical back: Neck supple.     Right lower leg: Edema present.     Left lower leg: Edema present.  Skin:    General: Skin is warm and dry.  Neurological:     General: No focal deficit present.     Mental Status: He is disoriented.     Comments: He will open his eyes and smile when stimulated.  There is no obvious facial droop.  He is not speaking.  He is moving his upper extremities without any difficulty.     ED Results / Procedures / Treatments   Labs (all labs ordered are listed, but only abnormal results are displayed) Labs Reviewed  CBC WITH DIFFERENTIAL/PLATELET - Abnormal; Notable for the following components:      Result Value   WBC 19.4 (*)    RBC 4.06 (*)    Hemoglobin 12.7 (*)    HCT 38.7 (*)    Neutro Abs 16.9 (*)    Monocytes Absolute 1.5 (*)    Abs Immature Granulocytes 0.16 (*)    All other components within normal limits  COMPREHENSIVE METABOLIC PANEL - Abnormal; Notable for the following components:   Potassium 5.6 (*)    Glucose, Bld 127 (*)    BUN 55 (*)    Creatinine, Ser 3.82 (*)    Calcium 8.6 (*)    Total Protein 6.4 (*)    Total Bilirubin 1.9 (*)    GFR, Estimated 14 (*)    All other components within normal limits  URINALYSIS, ROUTINE W REFLEX MICROSCOPIC - Abnormal; Notable for the following components:   Hgb urine dipstick LARGE (*)    Leukocytes,Ua TRACE (*)    RBC / HPF >50 (*)    Bacteria, UA RARE (*)    All other components within normal limits   COMPREHENSIVE METABOLIC PANEL - Abnormal; Notable for the following components:   Glucose, Bld 120 (*)    BUN 59 (*)    Creatinine, Ser 3.39 (*)    Calcium 8.3 (*)    Total Protein 5.5 (*)    Albumin 2.8 (*)  Alkaline Phosphatase 37 (*)    Total Bilirubin 1.3 (*)    GFR, Estimated 16 (*)    All other components within normal limits  CBC WITH DIFFERENTIAL/PLATELET - Abnormal; Notable for the following components:   WBC 14.3 (*)    RBC 3.46 (*)    Hemoglobin 10.8 (*)    HCT 33.1 (*)    Platelets 143 (*)    Neutro Abs 11.8 (*)    Monocytes Absolute 1.4 (*)    Abs Immature Granulocytes 0.10 (*)    All other components within normal limits  IRON AND TIBC - Abnormal; Notable for the following components:   Iron 31 (*)    TIBC 211 (*)    Saturation Ratios 15 (*)    All other components within normal limits  RETICULOCYTES - Abnormal; Notable for the following components:   RBC. 3.48 (*)    All other components within normal limits  CBG MONITORING, ED - Abnormal; Notable for the following components:   Glucose-Capillary 129 (*)    All other components within normal limits  TROPONIN I (HIGH SENSITIVITY) - Abnormal; Notable for the following components:   Troponin I (High Sensitivity) 61 (*)    All other components within normal limits  TROPONIN I (HIGH SENSITIVITY) - Abnormal; Notable for the following components:   Troponin I (High Sensitivity) 36 (*)    All other components within normal limits  RESP PANEL BY RT-PCR (FLU A&B, COVID) ARPGX2  AMMONIA  LIPASE, BLOOD  VITAMIN B12  FOLATE  FERRITIN  SODIUM, URINE, RANDOM  CREATININE, URINE, RANDOM    EKG EKG Interpretation  Date/Time:  Sunday November 19 2020 18:50:05 EDT Ventricular Rate:  90 PR Interval:  186 QRS Duration: 99 QT Interval:  374 QTC Calculation: 458 R Axis:   -50 Text Interpretation: Sinus rhythm Supraventricular bigeminy Left anterior fascicular block Abnormal R-wave progression, late transition No old  tracing to compare Confirmed by Meridee Score [phone]) on 11/19/2020 7:04:29 PM   Radiology CT Head Wo Contrast  Result Date: 11/19/2020 CLINICAL DATA:  Altered mental status EXAM: CT HEAD WITHOUT CONTRAST TECHNIQUE: Contiguous axial images were obtained from the base of the skull through the vertex without intravenous contrast. COMPARISON:  None. FINDINGS: Brain: No evidence of acute infarction, hemorrhage, hydrocephalus, extra-axial collection or mass lesion/mass effect. Chronic atrophic and ischemic changes are noted. Vascular: No hyperdense vessel or unexpected calcification. Skull: Normal. Negative for fracture or focal lesion. Sinuses/Orbits: No acute finding. Other: None IMPRESSION: Chronic atrophic and ischemic changes are noted. Electronically Signed   By: Alcide Clever M.D.   On: 11/19/2020 22:04   DG Chest Port 1 View  Result Date: 11/19/2020 CLINICAL DATA:  Lethargy EXAM: PORTABLE CHEST 1 VIEW COMPARISON:  01/28/2019 FINDINGS: Lung volumes are small, but are symmetric and are clear. The chin overlies the lung apices, however, no large pneumothorax is identified. No pleural effusion. Cardiac size is within normal limits when accounting for poor pulmonary insufflation. Tortuosity of the thoracic aorta is likely accentuated by poor insufflation. The pulmonary vascularity is normal. IMPRESSION: Pulmonary hypoinflation. Electronically Signed   By: Helyn Numbers MD   On: 11/19/2020 19:52    Procedures BLADDER CATHETERIZATION  Date/Time: 11/19/2020 9:18 PM Performed by: Terrilee Files, MD Authorized by: Terrilee Files, MD   Consent:    Consent obtained:  Verbal   Consent given by:  Healthcare agent   Risks, benefits, and alternatives were discussed: yes     Risks discussed:  False passage,  pain and urethral injury   Alternatives discussed:  No treatment and delayed treatment Universal protocol:    Procedure explained and questions answered to patient or proxy's satisfaction: yes      Patient identity confirmed:  Arm band Pre-procedure details:    Procedure purpose:  Therapeutic   Preparation: Patient was prepped and draped in usual sterile fashion   Anesthesia:    Anesthesia method:  None Procedure details:    Provider performed due to:  Complicated insertion   Catheter insertion:  Indwelling   Catheter type:  Coude   Bladder irrigation: no     Number of attempts:  1   Urine characteristics:  Bloody Post-procedure details:    Procedure completion:  Tolerated well, no immediate complications     Medications Ordered in ED Medications  sodium chloride 0.9 % bolus 1,000 mL (has no administration in time range)    ED Course  I have reviewed the triage vital signs and the nursing notes.  Pertinent labs & imaging results that were available during my care of the patient were reviewed by me and considered in my medical decision making (see chart for details).  Clinical Course as of 11/20/20 0950  Sun Nov 19, 2020  1928 Chest x-ray without definitive infiltrate.  Awaiting radiology reading. [MB]  2023 Patient's blood pressure up.  He is refused his medications today. [MB]  2042 He was able to see some labs from his facility.  His creatinine was 1.3 in February of this year. [MB]  2117 Nursing was unable to catheterized and caused some bleeding.  Bladder scan showed 400 cc.  I placed a coud catheter with a little bit of resistance.  He passed some clot and now draining yellow urine. [MB]  2140 Discussed with Dr. Mikeal HawthorneGarba [facility] hospitalist who will evaluate the patient for admission.  He has so I put the patient in for head CT. [MB]  2203 Head CT was done with significant atrophy. [MB]    Clinical Course User Index [MB] Terrilee FilesButler, Maziah Keeling C, MD   MDM Rules/Calculators/A&P                         This patient complains of lethargy vomiting; this involves an extensive number of treatment Options and is a complaint that carries with it a high risk of complications  and Morbidity. The differential includes aspiration, COVID, pneumonia Bolick derangement, dehydration or retention, UTI, stroke, bleed  I ordered, reviewed and interpreted labs, which included CBC with elevated white count, hemoglobin low unclear baseline, chemistries with elevated creatinine worse than priors, troponins elevated will need to be trended urinalysis with hematuria, COVID testing negative I ordered medication IV fluids I ordered imaging studies which included chest x-ray and head CT and I independently    visualized and interpreted imaging which showed no acute findings Additional history obtained from EMS and patient's daughter Previous records obtained and reviewed in epic, no recent admissions I consulted Dr. Mikeal HawthorneGarba [facility] hospitalist and discussed lab and imaging findings  Critical Interventions: None  After the interventions stated above, I reevaluated the patient and found to be patient to be comfortable.  Blood pressures elevated likely related to not taking his blood pressure medication today.  He will need to be admitted to the hospital for continued management.   Final Clinical Impression(s) / ED Diagnoses Final diagnoses:  Altered mental status, unspecified altered mental status type  AKI (acute kidney injury) (HCC)  Hematuria, unspecified type  Rx / DC Orders ED Discharge Orders    None       Terrilee Files, MD 11/20/20 [phone]

## 2020-11-20 ENCOUNTER — Other Ambulatory Visit: Payer: Self-pay

## 2020-11-20 ENCOUNTER — Inpatient Hospital Stay (HOSPITAL_COMMUNITY): Payer: Medicare Other

## 2020-11-20 ENCOUNTER — Encounter (HOSPITAL_COMMUNITY): Payer: Self-pay | Admitting: Internal Medicine

## 2020-11-20 DIAGNOSIS — E875 Hyperkalemia: Secondary | ICD-10-CM

## 2020-11-20 DIAGNOSIS — D72829 Elevated white blood cell count, unspecified: Secondary | ICD-10-CM

## 2020-11-20 DIAGNOSIS — E860 Dehydration: Secondary | ICD-10-CM

## 2020-11-20 DIAGNOSIS — I10 Essential (primary) hypertension: Secondary | ICD-10-CM

## 2020-11-20 DIAGNOSIS — F0390 Unspecified dementia without behavioral disturbance: Secondary | ICD-10-CM

## 2020-11-20 LAB — RETICULOCYTES
Immature Retic Fract: 9.1 % (ref 2.3–15.9)
RBC.: 3.48 MIL/uL — ABNORMAL LOW (ref 4.22–5.81)
Retic Count, Absolute: 27.1 10*3/uL (ref 19.0–186.0)
Retic Ct Pct: 0.8 % (ref 0.4–3.1)

## 2020-11-20 LAB — COMPREHENSIVE METABOLIC PANEL
ALT: 11 U/L (ref 0–44)
AST: 20 U/L (ref 15–41)
Albumin: 2.8 g/dL — ABNORMAL LOW (ref 3.5–5.0)
Alkaline Phosphatase: 37 U/L — ABNORMAL LOW (ref 38–126)
Anion gap: 9 (ref 5–15)
BUN: 59 mg/dL — ABNORMAL HIGH (ref 8–23)
CO2: 25 mmol/L (ref 22–32)
Calcium: 8.3 mg/dL — ABNORMAL LOW (ref 8.9–10.3)
Chloride: 104 mmol/L (ref 98–111)
Creatinine, Ser: 3.39 mg/dL — ABNORMAL HIGH (ref 0.61–1.24)
GFR, Estimated: 16 mL/min — ABNORMAL LOW (ref >60)
Glucose, Bld: 120 mg/dL — ABNORMAL HIGH (ref 70–99)
Potassium: 4.1 mmol/L (ref 3.5–5.1)
Sodium: 138 mmol/L (ref 135–145)
Total Bilirubin: 1.3 mg/dL — ABNORMAL HIGH (ref 0.3–1.2)
Total Protein: 5.5 g/dL — ABNORMAL LOW (ref 6.5–8.1)

## 2020-11-20 LAB — IRON AND TIBC
Iron: 31 ug/dL — ABNORMAL LOW (ref 45–182)
Saturation Ratios: 15 % — ABNORMAL LOW (ref 17.9–39.5)
TIBC: 211 ug/dL — ABNORMAL LOW (ref 250–450)
UIBC: 180 ug/dL

## 2020-11-20 LAB — CBC WITH DIFFERENTIAL/PLATELET
Abs Immature Granulocytes: 0.1 10*3/uL — ABNORMAL HIGH (ref 0.00–0.07)
Basophils Absolute: 0 10*3/uL (ref 0.0–0.1)
Basophils Relative: 0 %
Eosinophils Absolute: 0 10*3/uL (ref 0.0–0.5)
Eosinophils Relative: 0 %
HCT: 33.1 % — ABNORMAL LOW (ref 39.0–52.0)
Hemoglobin: 10.8 g/dL — ABNORMAL LOW (ref 13.0–17.0)
Immature Granulocytes: 1 %
Lymphocytes Relative: 7 %
Lymphs Abs: 1 10*3/uL (ref 0.7–4.0)
MCH: 31.2 pg (ref 26.0–34.0)
MCHC: 32.6 g/dL (ref 30.0–36.0)
MCV: 95.7 fL (ref 80.0–100.0)
Monocytes Absolute: 1.4 10*3/uL — ABNORMAL HIGH (ref 0.1–1.0)
Monocytes Relative: 10 %
Neutro Abs: 11.8 10*3/uL — ABNORMAL HIGH (ref 1.7–7.7)
Neutrophils Relative %: 82 %
Platelets: 143 10*3/uL — ABNORMAL LOW (ref 150–400)
RBC: 3.46 MIL/uL — ABNORMAL LOW (ref 4.22–5.81)
RDW: 14 % (ref 11.5–15.5)
WBC: 14.3 10*3/uL — ABNORMAL HIGH (ref 4.0–10.5)
nRBC: 0 % (ref 0.0–0.2)

## 2020-11-20 LAB — FOLATE: Folate: 6.4 ng/mL (ref >5.9)

## 2020-11-20 LAB — TROPONIN I (HIGH SENSITIVITY): Troponin I (High Sensitivity): 36 ng/L — ABNORMAL HIGH (ref <18)

## 2020-11-20 LAB — VITAMIN B12: Vitamin B-12: 580 pg/mL (ref 180–914)

## 2020-11-20 LAB — CREATININE, URINE, RANDOM: Creatinine, Urine: 93.85 mg/dL

## 2020-11-20 LAB — AMMONIA: Ammonia: 14 umol/L (ref 9–35)

## 2020-11-20 LAB — FERRITIN: Ferritin: 170 ng/mL (ref 24–336)

## 2020-11-20 LAB — SODIUM, URINE, RANDOM: Sodium, Ur: 52 mmol/L

## 2020-11-20 MED ORDER — LOPERAMIDE HCL 2 MG PO CAPS
2.0000 mg | ORAL_CAPSULE | ORAL | Status: DC | PRN
  Administered 2020-11-20: 2 mg via ORAL
  Filled 2020-11-20: qty 1

## 2020-11-20 MED ORDER — ADULT MULTIVITAMIN W/MINERALS CH
1.0000 | ORAL_TABLET | Freq: Every day | ORAL | Status: DC
  Administered 2020-11-20: 1 via ORAL
  Filled 2020-11-20 (×2): qty 1

## 2020-11-20 MED ORDER — IPRATROPIUM BROMIDE 0.03 % NA SOLN
2.0000 | Freq: Two times a day (BID) | NASAL | Status: DC
  Administered 2020-11-20 – 2020-11-27 (×11): 2 via NASAL
  Filled 2020-11-20: qty 30

## 2020-11-20 MED ORDER — QUETIAPINE FUMARATE 25 MG PO TABS: 25.0000 mg | ORAL_TABLET | Freq: Every day | ORAL | Status: DC

## 2020-11-20 MED ORDER — DONEPEZIL HCL 10 MG PO TABS: 10.0000 mg | ORAL_TABLET | Freq: Every day | ORAL | Status: DC

## 2020-11-20 MED ORDER — QUETIAPINE FUMARATE 25 MG PO TABS
12.5000 mg | ORAL_TABLET | Freq: Two times a day (BID) | ORAL | Status: DC
  Administered 2020-11-20: 12.5 mg via ORAL
  Filled 2020-11-20 (×2): qty 1

## 2020-11-20 MED ORDER — ENSURE ENLIVE PO LIQD
237.0000 mL | Freq: Three times a day (TID) | ORAL | Status: DC
  Administered 2020-11-20 – 2020-11-27 (×15): 237 mL via ORAL

## 2020-11-20 MED ORDER — VITAMIN B-12 1000 MCG PO TABS
1000.0000 ug | ORAL_TABLET | Freq: Every day | ORAL | Status: DC
  Administered 2020-11-20: 1000 ug via ORAL
  Filled 2020-11-20 (×2): qty 1

## 2020-11-20 MED ORDER — LIP MEDEX EX OINT
TOPICAL_OINTMENT | CUTANEOUS | Status: DC | PRN
  Filled 2020-11-20: qty 7

## 2020-11-20 MED ORDER — CHLORHEXIDINE GLUCONATE CLOTH 2 % EX PADS
6.0000 | MEDICATED_PAD | Freq: Every day | CUTANEOUS | Status: DC
  Administered 2020-11-20 – 2020-11-27 (×8): 6 via TOPICAL

## 2020-11-20 MED ORDER — SODIUM CHLORIDE 0.9 % IV BOLUS
1000.0000 mL | Freq: Once | INTRAVENOUS | Status: AC
  Administered 2020-11-20: 1000 mL via INTRAVENOUS

## 2020-11-20 MED ORDER — CITALOPRAM HYDROBROMIDE 20 MG PO TABS
10.0000 mg | ORAL_TABLET | Freq: Every day | ORAL | Status: DC
  Administered 2020-11-20: 10 mg via ORAL
  Filled 2020-11-20 (×2): qty 1

## 2020-11-20 NOTE — Progress Notes (Addendum)
PROGRESS NOTE    Troy CLINGER Sr.  YIF:[id number] DOB: [birth year] DOA: 11/19/2020 PCP: Sunday Spillers, NP    Chief Complaint  Patient presents with  . Altered Mental Status    Brief Narrative:  Patient 87 year old gentleman history of advanced Alzheimer's dementia living in a memory care unit, depression, degenerative disc disease, hypertension presented to the ED with his daughter secondary to worsening mental status.  Patient noted to have been wheelchair-bound mainly.  Patient seen in the ED noted to be severely dehydrated and acute renal failure and oliguric.  Also concern for possible aspiration as patient noted to have decreased appetite.  Patient admitted for further evaluation and management.   Assessment & Plan:   Principal Problem:   AKI (acute kidney injury) (HCC) Active Problems:   Dementia (HCC)   Essential hypertension   Hyperkalemia   Leucocytosis   Dehydration  1 acute renal failure -Patient presented with acute renal failure with a creatinine of 3.82. -Unknown baseline at this time. -Likely secondary to prerenal azotemia as patient noted to be severely dehydrated on admission versus post renal azotemia (per daughter clots noted with foley insertion in ED) in the setting of diuretics and ACE inhibitor.   -Foley catheter placed. -Urine output of 1 L since admission. -Clear, yellow, large hemoglobin, negative for ketones, trace leukocytosis, nitrite negative, 0-5 WBCs. -Check urine sodium, urine creatinine, renal ultrasound. -1 L fluid bolus. -Continue IV fluids of D5 normal saline at 100 cc/h. -Continue to hold diuretics, ACE inhibitor. -Avoid nephrotoxins. -Monitor urine output. -Follow.  2.  Dehydration -1 L normal saline bolus. -Continue IV fluids D5 normal saline 100 cc an hour. -Follow.  3.  Hyperkalemia Likely secondary to problem #1. -Status post Lokelma. -Potassium currently of 4.1. -Follow.  4.  Anemia -Likely partly dilutional and  also in the setting of possible traumatic Foley catheter placement as patient noted to have some hematuria which seems to be clearing and improving with Foley catheter flushes. -Check an anemia panel. -Follow H&H. -Transfusion threshold hemoglobin < 7.  5.  Severe Alzheimer's dementia -Patient pleasantly confused. -Resume home regimen Celexa, Seroquel. -Per pharmacist patient not on Aricept at facility and as such has been discontinued.  6.  Hypertension -Patient noted to be significantly dehydrated on admission and as such we will avoid diuretics. -Continue to hold diuretics and ACE inhibitor secondary to problem #1. -Blood pressure stable. -Follow.  7.  Leukocytosis -Likely reactive leukocytosis. -Patient afebrile. -Urinalysis with trace leukocytes, nitrite negative, 0-5 WBCs. -Chest x-ray negative for any acute infiltrates. -Leukocytosis trending down. -Hold off on antibiotics at this time.    DVT prophylaxis: SCDs Code Status: DNR Family Communication: Updated patient.  Updated daughter Caprice Renshaw on the telephone.   Disposition:   Status is: Inpatient    Dispo: The patient is from: Memory care unit.              Anticipated d/c is to: Back to memory care unit when clinically stable with resolution of acute renal failure.              Patient currently in acute renal failure, on IV fluids.  Not stable for discharge.   Difficult to place patient no       Consultants:   None  Procedures:   CT head 11/19/2020  Chest x-ray 11/19/2020  Antimicrobials:   None   Subjective: Sleeping but arousable.  Denies any chest pain.  No shortness of breath.  No nausea.  No  vomiting.  No abdominal pain.  Pleasantly confused.  Foley catheter with pinkish urine.  Objective: Vitals:   11/19/20 2200 11/19/20 2215 11/19/20 2300 11/20/20 0454  BP: 137/73 (!) 151/93 (!) 129/94 114/60  Pulse: 82 81 74 (!) 103  Resp: (!) 23 20 20 16   Temp:   98.2 F (36.8 C) 97.7 F  (36.5 C)  TempSrc:   Oral Oral  SpO2: 94% 97% (!) 87% 96%  Weight:      Height:        Intake/Output Summary (Last 24 hours) at 11/20/2020 1048 Last data filed at 11/20/2020 1000 Gross per 24 hour  Intake 1609.59 ml  Output 1500 ml  Net 109.59 ml   Filed Weights   11/19/20 1846  Weight: 74.6 kg    Examination:  General exam: Mucous membranes extremely dry. Respiratory system: Clear to auscultation anterior lung fields. Respiratory effort normal. Cardiovascular system: S1 & S2 heard, RRR. No JVD, murmurs, rubs, gallops or clicks. No pedal edema. Gastrointestinal system: Abdomen is nondistended, soft and nontender. No organomegaly or masses felt. Normal bowel sounds heard. Central nervous system: Alert and oriented. No focal neurological deficits. Extremities: Symmetric 5 x 5 power. Skin: No rashes, lesions or ulcers Psychiatry: Judgement and insight appear normal. Mood & affect appropriate.     Data Reviewed: I have personally reviewed following labs and imaging studies  CBC: Recent Labs  Lab 11/19/20 1857 11/20/20 0503  WBC 19.4* 14.3*  NEUTROABS 16.9* 11.8*  HGB 12.7* 10.8*  HCT 38.7* 33.1*  MCV 95.3 95.7  PLT 160 143*    Basic Metabolic Panel: Recent Labs  Lab 11/19/20 1857 11/20/20 0437  NA 137 138  K 5.6* 4.1  CL 104 104  CO2 22 25  GLUCOSE 127* 120*  BUN 55* 59*  CREATININE 3.82* 3.39*  CALCIUM 8.6* 8.3*    GFR: Estimated Creatinine Clearance: 12.3 mL/min (A) (by C-G formula based on SCr of 3.39 mg/dL (H)).  Liver Function Tests: Recent Labs  Lab 11/19/20 1857 11/20/20 0437  AST 26 20  ALT 7 11  ALKPHOS 45 37*  BILITOT 1.9* 1.3*  PROT 6.4* 5.5*  ALBUMIN 3.5 2.8*    CBG: Recent Labs  Lab 11/19/20 1943  GLUCAP 129*     Recent Results (from the past 240 hour(s))  Resp Panel by RT-PCR (Flu A&B, Covid) Nasopharyngeal Swab     Status: None   Collection Time: 11/19/20  8:22 PM   Specimen: Nasopharyngeal Swab; Nasopharyngeal(NP)  swabs in vial transport medium  Result Value Ref Range Status   SARS Coronavirus 2 by RT PCR NEGATIVE NEGATIVE Final    Comment: (NOTE) SARS-CoV-2 target nucleic acids are NOT DETECTED.  The SARS-CoV-2 RNA is generally detectable in upper respiratory specimens during the acute phase of infection. The lowest concentration of SARS-CoV-2 viral copies this assay can detect is 138 copies/mL. A negative result does not preclude SARS-Cov-2 infection and should not be used as the sole basis for treatment or other patient management decisions. A negative result may occur with  improper specimen collection/handling, submission of specimen other than nasopharyngeal swab, presence of viral mutation(s) within the areas targeted by this assay, and inadequate number of viral copies(<138 copies/mL). A negative result must be combined with clinical observations, patient history, and epidemiological information. The expected result is Negative.  Fact Sheet for Patients:  11/21/20  Fact Sheet for Healthcare Providers:  BloggerCourse.com  This test is no t yet approved or cleared by the [employer] and  has been authorized for detection and/or diagnosis of SARS-CoV-2 by FDA under an Emergency Use Authorization (EUA). This EUA will remain  in effect (meaning this test can be used) for the duration of the COVID-19 declaration under Section 564(b)(1) of the Act, 21 U.S.C.section 360bbb-3(b)(1), unless the authorization is terminated  or revoked sooner.       Influenza A by PCR NEGATIVE NEGATIVE Final   Influenza B by PCR NEGATIVE NEGATIVE Final    Comment: (NOTE) The Xpert Xpress SARS-CoV-2/FLU/RSV plus assay is intended as an aid in the diagnosis of influenza from Nasopharyngeal swab specimens and should not be used as a sole basis for treatment. Nasal washings and aspirates are unacceptable for Xpert Xpress  SARS-CoV-2/FLU/RSV testing.  Fact Sheet for Patients: BloggerCourse.com  Fact Sheet for Healthcare Providers: [employer]ious[employer]ker[employer]it  This test is not yet approved or cleared by the [place] FDA and has been authorized for detection and/or diagnosis of SARS-CoV-2 by FDA under an Emergency Use Authorization (EUA). This EUA will remain in effect (meaning this test can be used) for the duration of the COVID-19 declaration under Section 564(b)(1) of the Act, 21 U.S.C. section 360bbb-3(b)(1), unless the authorization is terminated or revoked.  Performed at [facility], [address] [place], [place] [address]          Radiology Studies: CT Head Wo Contrast  Result Date: 11/19/2020 CLINICAL DATA:  Altered mental status EXAM: CT HEAD WITHOUT CONTRAST TECHNIQUE: Contiguous axial images were obtained from the base of the skull through the vertex without intravenous contrast. COMPARISON:  None. FINDINGS: Brain: No evidence of acute infarction, hemorrhage, hydrocephalus, extra-axial collection or mass lesion/mass effect. Chronic atrophic and ischemic changes are noted. Vascular: No hyperdense vessel or unexpected calcification. Skull: Normal. Negative for fracture or focal lesion. Sinuses/Orbits: No acute finding. Other: None IMPRESSION: Chronic atrophic and ischemic changes are noted. Electronically Signed   By: Alcide Clever M.D.   On: 11/19/2020 22:04   DG Chest Port 1 View  Result Date: 11/19/2020 CLINICAL DATA:  Lethargy EXAM: PORTABLE CHEST 1 VIEW COMPARISON:  01/28/2019 FINDINGS: Lung volumes are small, but are symmetric and are clear. The chin overlies the lung apices, however, no large pneumothorax is identified. No pleural effusion. Cardiac size is within normal limits when accounting for poor pulmonary insufflation. Tortuosity of the thoracic aorta is likely accentuated by poor insufflation. The pulmonary  vascularity is normal. IMPRESSION: Pulmonary hypoinflation. Electronically Signed   By: Helyn Numbers MD   On: 11/19/2020 19:52        Scheduled Meds: . Chlorhexidine Gluconate Cloth  6 each Topical Daily  . citalopram  10 mg Oral Daily  . donepezil  10 mg Oral QHS  . enoxaparin (LOVENOX) injection  30 mg Subcutaneous Q24H  . ipratropium  2 spray Each Nare Q12H  . QUEtiapine  25 mg Oral QHS  . vitamin B-12  1,000 mcg Oral Daily   Continuous Infusions: . dextrose 5 % and 0.9% NaCl 100 mL/hr at 11/20/20 0615     LOS: 1 day    Time spent: 40 minutes    Ramiro Harvest, MD [facility]   To contact the attending provider between 7A-7P or the covering provider during after hours 7P-7A, please log into the web site [email or web address] and access using universal [place] password for that web site. If you do not have the password, please call the hospital operator.  11/20/2020, 10:48 AM

## 2020-11-20 NOTE — Progress Notes (Signed)
Initial Nutrition Assessment  DOCUMENTATION CODES:  Not applicable  INTERVENTION:  Continue current diet order.  Recommend 1:1 feeding assistance at meals.  Add Ensure Enlive po TID, each supplement provides 350 kcal and 20 grams of protein.  Add Magic cup TID with meals, each supplement provides 290 kcal and 9 grams of protein.  Add MVI with minerals daily.  NUTRITION DIAGNOSIS:  Inadequate oral intake related to lethargy/confusion as evidenced by per patient/family report (MD note).  GOAL:  Patient will meet greater than or equal to 90% of their needs  MONITOR:  PO intake,Supplement acceptance,Labs,Weight trends,Skin  REASON FOR ASSESSMENT:  Malnutrition Screening Tool    ASSESSMENT:  87 yo male with a PMH of advanced Alzheimer's dementia who lives in the [facility], depression, degenerative disc disease, and HTN who presents with worsening AMS and AKI. Of note, pt is primarily wheelchair-bound.  Pt unable to give any history. Pt continued to refer to RD as "sir" and asked what RD was doing for lunch today whenever questions were asked.  Pt's weight has remained perfectly stable for the past year per Epic. On exam, pt had some depletions in both fat and muscle, however, they are likely due to age. However, pt at risk for malnutrition if PO intake continues to decrease.  Given that pt came in with some poor PO intake, recommend adding Ensure TID and Magic Cup TID to promote intake. Also recommend adding MVI with minerals daily. Given pt's increased AMS, pt may benefit from 1:1 feeding assistance during meals.  Medications: Vitamin B12 1000 mcg, IVF @ 100 ml/hr Labs: reviewed; serum Ca 8.3, CBG 129  NUTRITION - FOCUSED PHYSICAL EXAM: Flowsheet Row Most Recent Value  Orbital Region Mild depletion  Upper Arm Region Moderate depletion  Thoracic and Lumbar Region No depletion  Buccal Region Mild depletion  Temple Region Mild depletion  Clavicle Bone Region No  depletion  Clavicle and Acromion Bone Region No depletion  Scapular Bone Region Unable to assess  Dorsal Hand Mild depletion  Patellar Region No depletion  Anterior Thigh Region No depletion  Posterior Calf Region Mild depletion  Edema (RD Assessment) None  Hair Reviewed  Eyes Reviewed  Mouth Reviewed  Skin Reviewed  [dry skin]  Nails Reviewed     Diet Order:   Diet Order            Diet regular Room service appropriate? Yes; Fluid consistency: Thin  Diet effective now                EDUCATION NEEDS:  Education needs have been addressed  Skin:  Skin Assessment: Reviewed RN Assessment (Dry skin)  Last BM:  PTA/unknown  Height:  Ht Readings from Last 1 Encounters:  11/19/20 5\' 6"  (1.676 m)   Weight:  Wt Readings from Last 1 Encounters:  11/19/20 74.6 kg   Ideal Body Weight:  64.5 kg  BMI:  Body mass index is 26.55 kg/m.  Estimated Nutritional Needs:  Kcal:  1550-1750 Protein:  80-95 grams Fluid:  >1.5 L  11/21/20, RD, LDN Registered Dietitian After Hours/Weekend Pager # in [place]

## 2020-11-20 NOTE — Evaluation (Signed)
Physical Therapy Evaluation-1x Patient Details Name: Troy DELASHMIT Sr. MRN: [id number] DOB: Dec 08, 1924 Today's Date: 11/20/2020   History of Present Illness  87 yo male admitted with AMS, ARF. Hx of Alz dementia, OA. [name] is from a memory care unit.  Clinical Impression  Bed level eval only. [name] awaiting nursing assistance for clean up 2* bowel incontinence. Family reports this is [name]'s 3rd loose stool today. [name] follows 1 step commands inconsistently with multimodal repeated cues during UE/LE assessment. He does not appear to be able to meaningfully participate with therapy. Per family, he requires +2 assist for bed<>chair transfers at baseline. Based on discussion with family, [name] seems to be at a custodial level of care. Recommend return to ALF/memory care at discharge once medically stable. Recommend OOB to chair with nursing with lift equipment for safety. Please reorder if d/c plan or needs change.    Follow Up Recommendations Supervision/Assistance - 24 hour; Return to memory care unit    Equipment Recommendations  None recommended by [name]    Recommendations for Other Services       Precautions / Restrictions Precautions Precautions: Fall Precaution Comments: incontinent Restrictions Weight Bearing Restrictions: No      Mobility  Bed Mobility               General bed mobility comments: NT-[name] awaiting nursing assistance-soild bed. 3rd BM today per family    Transfers                    Ambulation/Gait                Stairs            Wheelchair Mobility    Modified Rankin (Stroke Patients Only)       Balance                                             Pertinent Vitals/Pain Pain Assessment: Faces Faces Pain Scale: No hurt    Home Living Family/patient expects to be discharged to:: Assisted living (Memory Care)               Home Equipment: Wheelchair - manual      Prior Function Level of Independence:  Needs assistance   Gait / Transfers Assistance Needed: +2 assist for transfers at baseline (per family report)  ADL's / Homemaking Assistance Needed: total assist at baseline (per family report)  Comments: family present during session. Reports [name] required +2 assist for mobility and [name] has difficulty pivoting/taking steps 2* arthritic knees.     Hand Dominance        Extremity/Trunk Assessment   Upper Extremity Assessment Upper Extremity Assessment: Generalized weakness    Lower Extremity Assessment Lower Extremity Assessment: Generalized weakness    Cervical / Trunk Assessment Cervical / Trunk Assessment: Kyphotic  Communication      Cognition Arousal/Alertness: Awake/alert Behavior During Therapy: WFL for tasks assessed/performed Overall Cognitive Status: History of cognitive impairments - at baseline                                 General Comments: [name] follows 1 step commands inconsistently.      General Comments      Exercises     Assessment/Plan    [name] Assessment [name] does not  need any further [name] services  [name] Problem List         [name] Treatment Interventions      [name] Goals (Current goals can be found in the Care Plan section)  Acute Rehab [name] Goals Patient Stated Goal: per family, return to memory care unit [name] Goal Formulation: All assessment and education complete, DC therapy    Frequency     Barriers to discharge        Co-evaluation               AM-PAC [name] "6 Clicks" Mobility  Outcome Measure Help needed turning from your back to your side while in a flat bed without using bedrails?: Total Help needed moving from lying on your back to sitting on the side of a flat bed without using bedrails?: Total Help needed moving to and from a bed to a chair (including a wheelchair)?: Total Help needed standing up from a chair using your arms (e.g., wheelchair or bedside chair)?: Total Help needed to walk in hospital room?: Total Help  needed climbing 3-5 steps with a railing? : Total 6 Click Score: 6    End of Session   Activity Tolerance: Patient tolerated treatment well Patient left: in bed;with call bell/phone within reach;with family/visitor present        Time: 7654-6503 [name] Time Calculation (min) (ACUTE ONLY): 14 min   Charges:   [name] Evaluation $[name] Eval Low Complexity: 1 Low            Faye Ramsay, [name] Acute Rehabilitation  Office: [phone] Pager: [phone]

## 2020-11-20 NOTE — Progress Notes (Signed)
Notified MD of large amount of dark red bleeding from foley cath. Stat CBC ordered.

## 2020-11-20 NOTE — Progress Notes (Signed)
[facility] [address] [employer] [facility]) Hospital Liaison note:  This patient is a currently enrolled in [facility] outpatient-based Palliative Care. Will continue to follow for disposition.  Please call with any outpatient palliative questions or concerns.  Thank you, Abran Cantor, LPN [facility] Liaison [phone]

## 2020-11-21 DIAGNOSIS — E876 Hypokalemia: Secondary | ICD-10-CM

## 2020-11-21 DIAGNOSIS — R197 Diarrhea, unspecified: Secondary | ICD-10-CM

## 2020-11-21 LAB — COMPREHENSIVE METABOLIC PANEL
ALT: 14 U/L (ref 0–44)
AST: 18 U/L (ref 15–41)
Albumin: 2.7 g/dL — ABNORMAL LOW (ref 3.5–5.0)
Alkaline Phosphatase: 32 U/L — ABNORMAL LOW (ref 38–126)
Anion gap: 7 (ref 5–15)
BUN: 40 mg/dL — ABNORMAL HIGH (ref 8–23)
CO2: 25 mmol/L (ref 22–32)
Calcium: 8.1 mg/dL — ABNORMAL LOW (ref 8.9–10.3)
Chloride: 108 mmol/L (ref 98–111)
Creatinine, Ser: 1.6 mg/dL — ABNORMAL HIGH (ref 0.61–1.24)
GFR, Estimated: 40 mL/min — ABNORMAL LOW (ref >60)
Glucose, Bld: 89 mg/dL (ref 70–99)
Potassium: 3 mmol/L — ABNORMAL LOW (ref 3.5–5.1)
Sodium: 140 mmol/L (ref 135–145)
Total Bilirubin: 0.9 mg/dL (ref 0.3–1.2)
Total Protein: 5.1 g/dL — ABNORMAL LOW (ref 6.5–8.1)

## 2020-11-21 LAB — CBC WITH DIFFERENTIAL/PLATELET
Abs Immature Granulocytes: 0.03 10*3/uL (ref 0.00–0.07)
Basophils Absolute: 0 10*3/uL (ref 0.0–0.1)
Basophils Relative: 0 %
Eosinophils Absolute: 0.2 10*3/uL (ref 0.0–0.5)
Eosinophils Relative: 3 %
HCT: 33.1 % — ABNORMAL LOW (ref 39.0–52.0)
Hemoglobin: 10.3 g/dL — ABNORMAL LOW (ref 13.0–17.0)
Immature Granulocytes: 0 %
Lymphocytes Relative: 12 %
Lymphs Abs: 0.9 10*3/uL (ref 0.7–4.0)
MCH: 30.9 pg (ref 26.0–34.0)
MCHC: 31.1 g/dL (ref 30.0–36.0)
MCV: 99.4 fL (ref 80.0–100.0)
Monocytes Absolute: 0.7 10*3/uL (ref 0.1–1.0)
Monocytes Relative: 10 %
Neutro Abs: 5.6 10*3/uL (ref 1.7–7.7)
Neutrophils Relative %: 75 %
Platelets: 143 10*3/uL — ABNORMAL LOW (ref 150–400)
RBC: 3.33 MIL/uL — ABNORMAL LOW (ref 4.22–5.81)
RDW: 13.8 % (ref 11.5–15.5)
WBC: 7.5 10*3/uL (ref 4.0–10.5)
nRBC: 0 % (ref 0.0–0.2)

## 2020-11-21 LAB — MAGNESIUM: Magnesium: 1.5 mg/dL — ABNORMAL LOW (ref 1.7–2.4)

## 2020-11-21 MED ORDER — POTASSIUM CHLORIDE CRYS ER 20 MEQ PO TBCR
20.0000 meq | EXTENDED_RELEASE_TABLET | Freq: Once | ORAL | Status: DC
  Filled 2020-11-21: qty 1

## 2020-11-21 MED ORDER — MAGNESIUM SULFATE 4 GM/100ML IV SOLN
4.0000 g | Freq: Once | INTRAVENOUS | Status: AC
  Administered 2020-11-21: 4 g via INTRAVENOUS
  Filled 2020-11-21: qty 100

## 2020-11-21 MED ORDER — SACCHAROMYCES BOULARDII 250 MG PO CAPS
250.0000 mg | ORAL_CAPSULE | Freq: Two times a day (BID) | ORAL | Status: DC
  Filled 2020-11-21: qty 1

## 2020-11-21 MED ORDER — POTASSIUM CHLORIDE 10 MEQ/100ML IV SOLN
10.0000 meq | INTRAVENOUS | Status: AC
  Administered 2020-11-21 (×4): 10 meq via INTRAVENOUS
  Filled 2020-11-21 (×4): qty 100

## 2020-11-21 MED ORDER — CYANOCOBALAMIN 1000 MCG/ML IJ SOLN
1000.0000 ug | Freq: Every day | INTRAMUSCULAR | Status: AC
  Administered 2020-11-21 – 2020-11-27 (×7): 1000 ug via SUBCUTANEOUS
  Filled 2020-11-21 (×7): qty 1

## 2020-11-21 MED ORDER — VITAMIN B-12 1000 MCG PO TABS: 1000.0000 ug | ORAL_TABLET | Freq: Every day | ORAL | Status: DC

## 2020-11-21 MED ORDER — ADULT MULTIVITAMIN W/MINERALS CH
1.0000 | ORAL_TABLET | Freq: Every day | ORAL | Status: DC
  Administered 2020-11-21 – 2020-11-27 (×6): 1 via ORAL
  Filled 2020-11-21 (×6): qty 1

## 2020-11-21 MED ORDER — CITALOPRAM HYDROBROMIDE 10 MG PO TABS
5.0000 mg | ORAL_TABLET | Freq: Every day | ORAL | Status: DC
  Administered 2020-11-21 – 2020-11-27 (×6): 5 mg via ORAL
  Filled 2020-11-21 (×7): qty 1

## 2020-11-21 MED ORDER — CITALOPRAM HYDROBROMIDE 20 MG PO TABS: 10.0000 mg | ORAL_TABLET | Freq: Every day | ORAL | Status: DC

## 2020-11-21 MED ORDER — POTASSIUM CHLORIDE CRYS ER 20 MEQ PO TBCR
40.0000 meq | EXTENDED_RELEASE_TABLET | Freq: Once | ORAL | Status: DC
  Filled 2020-11-21: qty 2

## 2020-11-21 MED ORDER — QUETIAPINE FUMARATE 25 MG PO TABS
12.5000 mg | ORAL_TABLET | Freq: Every day | ORAL | Status: DC
  Administered 2020-11-21 – 2020-11-26 (×6): 12.5 mg via ORAL
  Filled 2020-11-21 (×6): qty 1

## 2020-11-21 MED ORDER — FOOD THICKENER (SIMPLYTHICK)
1.0000 | ORAL | Status: DC | PRN
  Filled 2020-11-21: qty 1

## 2020-11-21 MED ORDER — SACCHAROMYCES BOULARDII 250 MG PO CAPS
250.0000 mg | ORAL_CAPSULE | Freq: Two times a day (BID) | ORAL | Status: DC
  Administered 2020-11-21 – 2020-11-27 (×11): 250 mg via ORAL
  Filled 2020-11-21 (×11): qty 1

## 2020-11-21 NOTE — Progress Notes (Addendum)
PROGRESS NOTE    Troy CORNFORTH Sr.  YQM:[id number] DOB: [birth year] DOA: 11/19/2020 PCP: Sunday Spillers, NP    Chief Complaint  Patient presents with  . Altered Mental Status    Brief Narrative:  Patient 87 year old gentleman history of advanced Alzheimer's dementia living in a memory care unit, depression, degenerative disc disease, hypertension presented to the ED with his daughter secondary to worsening mental status.  Patient noted to have been wheelchair-bound mainly.  Patient seen in the ED noted to be severely dehydrated and acute renal failure and oliguric.  Also concern for possible aspiration as patient noted to have decreased appetite.  Patient admitted for further evaluation and management.   Assessment & Plan:   Principal Problem:   AKI (acute kidney injury) (HCC) Active Problems:   Dementia (HCC)   Essential hypertension   Hyperkalemia   Leucocytosis   Dehydration   Diarrhea   Hypokalemia  1 acute renal failure -Patient presented with acute renal failure with a creatinine of 3.82. -Unknown baseline at this time. -Likely secondary to prerenal azotemia as patient noted to be severely dehydrated on admission versus post renal azotemia (per daughter clots noted with foley insertion in ED) in the setting of diuretics and ACE inhibitor.   -Foley catheter placed. -Urine output of 1.775 L over the past 24 hours.   -Urine cleared up.   -Urine sodium 52, urine creatinine of 93.85.  -Renal ultrasound with bilateral simple appearing renal cysts, no other focal abnormality noted.   -Avoid nephrotoxins.   -Renal function improving creatinine trending down currently at 1.60 from 3.82 on admission.   -Continue hydration with IV fluids for another 24 to 48 hours. -Discontinue Foley tomorrow morning. -Monitor urine output.   -Follow.  2.  Dehydration - continue IV fluids with D5NS 100cc/hr  3.  Hyperkalemia Likely secondary to problem #1. -Status post  Lokelma. -Resolved.  Patient now hypokalemic with a potassium of 3.0.   -Follow.  4.  Anemia -Likely partly dilutional and also in the setting of possible traumatic Foley catheter placement as patient noted to have some hematuria which seems to be clearing and improving with Foley catheter flushes. -Hemoglobin currently stable at 10.3.  -Consistent with anemia of chronic disease.  Folate at 6.4.  Vitamin B12 of 580.  -Follow H&H.   -Transfusion threshold hemoglobin < 7.  5.  Severe Alzheimer's dementia -Patient pleasantly confused. -Decrease Celexa to 5 mg daily per family request who want patient weaned off.  -Decrease Seroquel to 12.5 mg nightly. -Per pharmacist patient not on Aricept at facility and as such has been discontinued. -Patient likely with issues with dysphagia secondary to dementia and as such SLP ordered. -We will also consult with palliative care for goals of care as per family patient has been slowly declining.  6.  Hypertension -Patient noted to be significantly dehydrated on admission and as such we will avoid diuretics. -Continue to hold diuretics and ACE inhibitor secondary to problem #1. -Continue gentle hydration.   -Follow.    7.  Leukocytosis -Likely reactive leukocytosis. -Patient afebrile. -Urinalysis with trace leukocytes, nitrite negative, 0-5 WBCs. -Chest x-ray negative for any acute infiltrates. -Leukocytosis trended down and seems to have resolved.   -No need for antibiotics.  8.  Hypokalemia/hypomagnesemia -Magnesium sulfate 4 g IV x1. -Oral potassium supplementation given but per RN patient not taking any oral intake and as such we will place on KCl 10 mEq IV every hour x4 months.  9.  Diarrhea Per  RN patient with diarrhea yesterday and was placed on contact precautions. -Stool studies ordered however patient with no bowel movement. -Discontinue stool studies. -Supportive care. -Follow.  10.??  Dysphagia -Likely secondary to worsening  Alzheimer's dementia. -RN patient with some coughing with oral intake. -Change diet to dysphagia 3 diet. -SLP.    DVT prophylaxis: SCDs Code Status: DNR Family Communication: Updated patient.  Updated daughter [name] Citizen at bedside.  Disposition:   Status is: Inpatient    Dispo: The patient is from: Memory care unit.              Anticipated d/c is to: Back to memory care unit when clinically stable with resolution of acute renal failure.              Patient currently in acute renal failure, on IV fluids.  Not stable for discharge.   Difficult to place patient no       Consultants:   None  Procedures:   CT head 11/19/2020  Chest x-ray 11/19/2020  Antimicrobials:   None   Subjective: Patient sleeping and snoring.  Noted to have received Seroquel last night.  Per RN no further loose stools.  Per RN patient coughing with oral intake.    Objective: Vitals:   11/20/20 1149 11/20/20 1318 11/20/20 2030 11/21/20 0446  BP: 125/69 113/61 (!) 142/78 140/74  Pulse: (!) 59 62 (!) 56 (!) 108  Resp: 18 19 18 20   Temp: 98 F (36.7 C) 98.4 F (36.9 C) 98 F (36.7 C) 98 F (36.7 C)  TempSrc: Oral Oral    SpO2: 98% 97% 96% 97%  Weight:      Height:        Intake/Output Summary (Last 24 hours) at 11/21/2020 1106 Last data filed at 11/21/2020 0913 Gross per 24 hour  Intake 1547.25 ml  Output 1325 ml  Net 222.25 ml   Filed Weights   11/19/20 1846  Weight: 74.6 kg    Examination:  General exam: Snoring.  Mucous membranes dry. Respiratory system: CTA B anterior lung fields.  No wheezes, no crackles, no rhonchi.  Normal respiratory effort.   Cardiovascular system: Regular rate and rhythm no murmurs rubs or gallops.  No JVD.  No lower extremity edema.   Gastrointestinal system: Abdomen is soft, nontender, nondistended, positive bowel sounds.  No rebound.  No guarding.  Central nervous system: Sound asleep.  Snoring. No focal neurological deficits.  Moving extremities  spontaneously. Extremities: Symmetric 5 x 5 power. Skin: No rashes, lesions or ulcers Psychiatry: Judgement and insight appear poor. Mood & affect appropriate.     Data Reviewed: I have personally reviewed following labs and imaging studies  CBC: Recent Labs  Lab 11/19/20 1857 11/20/20 0503 11/21/20 0550  WBC 19.4* 14.3* 7.5  NEUTROABS 16.9* 11.8* 5.6  HGB 12.7* 10.8* 10.3*  HCT 38.7* 33.1* 33.1*  MCV 95.3 95.7 99.4  PLT 160 143* 143*    Basic Metabolic Panel: Recent Labs  Lab 11/19/20 1857 11/20/20 0437 11/21/20 0550  NA 137 138 140  K 5.6* 4.1 3.0*  CL 104 104 108  CO2 22 25 25   GLUCOSE 127* 120* 89  BUN 55* 59* 40*  CREATININE 3.82* 3.39* 1.60*  CALCIUM 8.6* 8.3* 8.1*  MG  --   --  1.5*    GFR: Estimated Creatinine Clearance: 26 mL/min (A) (by C-G formula based on SCr of 1.6 mg/dL (H)).  Liver Function Tests: Recent Labs  Lab 11/19/20 1857 11/20/20 0437 11/21/20 0550  AST  26 20 18   ALT 7 11 14   ALKPHOS 45 37* 32*  BILITOT 1.9* 1.3* 0.9  PROT 6.4* 5.5* 5.1*  ALBUMIN 3.5 2.8* 2.7*    CBG: Recent Labs  Lab 11/19/20 1943  GLUCAP 129*     Recent Results (from the past 240 hour(s))  Resp Panel by RT-PCR (Flu A&B, Covid) Nasopharyngeal Swab     Status: None   Collection Time: 11/19/20  8:22 PM   Specimen: Nasopharyngeal Swab; Nasopharyngeal(NP) swabs in vial transport medium  Result Value Ref Range Status   SARS Coronavirus 2 by RT PCR NEGATIVE NEGATIVE Final    Comment: (NOTE) SARS-CoV-2 target nucleic acids are NOT DETECTED.  The SARS-CoV-2 RNA is generally detectable in upper respiratory specimens during the acute phase of infection. The lowest concentration of SARS-CoV-2 viral copies this assay can detect is 138 copies/mL. A negative result does not preclude SARS-Cov-2 infection and should not be used as the sole basis for treatment or other patient management decisions. A negative result may occur with  improper specimen  collection/handling, submission of specimen other than nasopharyngeal swab, presence of viral mutation(s) within the areas targeted by this assay, and inadequate number of viral copies(<138 copies/mL). A negative result must be combined with clinical observations, patient history, and epidemiological information. The expected result is Negative.  Fact Sheet for Patients:  BloggerCourse.comhttps://www.fda.gov/media/152166/download  Fact Sheet for Healthcare Providers:  SeriousBroker.ithttps://www.fda.gov/media/152162/download  This test is no t yet approved or cleared by the [place] FDA and  has been authorized for detection and/or diagnosis of SARS-CoV-2 by FDA under an Emergency Use Authorization (EUA). This EUA will remain  in effect (meaning this test can be used) for the duration of the COVID-19 declaration under Section 564(b)(1) of the Act, 21 U.S.C.section 360bbb-3(b)(1), unless the authorization is terminated  or revoked sooner.       Influenza A by PCR NEGATIVE NEGATIVE Final   Influenza B by PCR NEGATIVE NEGATIVE Final    Comment: (NOTE) The Xpert Xpress SARS-CoV-2/FLU/RSV plus assay is intended as an aid in the diagnosis of influenza from Nasopharyngeal swab specimens and should not be used as a sole basis for treatment. Nasal washings and aspirates are unacceptable for Xpert Xpress SARS-CoV-2/FLU/RSV testing.  Fact Sheet for Patients: BloggerCourse.comhttps://www.fda.gov/media/152166/download  Fact Sheet for Healthcare Providers: SeriousBroker.ithttps://www.fda.gov/media/152162/download  This test is not yet approved or cleared by the [place] FDA and has been authorized for detection and/or diagnosis of SARS-CoV-2 by FDA under an Emergency Use Authorization (EUA). This EUA will remain in effect (meaning this test can be used) for the duration of the COVID-19 declaration under Section 564(b)(1) of the Act, 21 U.S.C. section 360bbb-3(b)(1), unless the authorization is terminated or revoked.  Performed at [facility], [address] [place], [place] [address]          Radiology Studies: CT Head Wo Contrast  Result Date: 11/19/2020 CLINICAL DATA:  Altered mental status EXAM: CT HEAD WITHOUT CONTRAST TECHNIQUE: Contiguous axial images were obtained from the base of the skull through the vertex without intravenous contrast. COMPARISON:  None. FINDINGS: Brain: No evidence of acute infarction, hemorrhage, hydrocephalus, extra-axial collection or mass lesion/mass effect. Chronic atrophic and ischemic changes are noted. Vascular: No hyperdense vessel or unexpected calcification. Skull: Normal. Negative for fracture or focal lesion. Sinuses/Orbits: No acute finding. Other: None IMPRESSION: Chronic atrophic and ischemic changes are noted. Electronically Signed   By: Alcide CleverMark  Lukens M.D.   On: 11/19/2020 22:04   US RENAL  Result  Date: 11/20/2020 CLINICAL DATA:  Acute renal injury EXAM: RENAL / URINARY TRACT ULTRASOUND COMPLETE COMPARISON:  CT from 05/19/2019 FINDINGS: Right Kidney: Renal measurements: 10.9 x 4.6 x 5.1 cm. = volume: 135 mL. No mass lesion or hydronephrosis is noted. Cysts are seen within the right kidney the largest of which measures 2.9 cm. These are roughly stable from the prior CT examination. Left Kidney: Renal measurements: 11.4 x 6.2 x 6.0 cm. = volume: 224 mL. Multiple cysts are noted similar to that seen on the right. The largest of these measures 4.2 cm in the midportion of the left kidney. No mass lesion or hydronephrosis is noted. Bladder: Decompressed by Foley catheter. Other: None. IMPRESSION: Bilateral simple appearing renal cysts. No other focal abnormality is noted. Electronically Signed   By: Alcide Clever M.D.   On: 11/20/2020 20:02   DG Chest Port 1 View  Result Date: 11/19/2020 CLINICAL DATA:  Lethargy EXAM: PORTABLE CHEST 1 VIEW COMPARISON:  01/28/2019 FINDINGS: Lung volumes are small, but are symmetric and are clear. The chin overlies the lung apices, however,  no large pneumothorax is identified. No pleural effusion. Cardiac size is within normal limits when accounting for poor pulmonary insufflation. Tortuosity of the thoracic aorta is likely accentuated by poor insufflation. The pulmonary vascularity is normal. IMPRESSION: Pulmonary hypoinflation. Electronically Signed   By: Helyn Numbers MD   On: 11/19/2020 19:52        Scheduled Meds: . Chlorhexidine Gluconate Cloth  6 each Topical Daily  . citalopram  10 mg Oral Daily  . feeding supplement  237 mL Oral TID WC  . ipratropium  2 spray Each Nare Q12H  . multivitamin with minerals  1 tablet Oral Daily  . QUEtiapine  12.5 mg Oral QHS  . saccharomyces boulardii  250 mg Oral BID  . vitamin B-12  1,000 mcg Oral Daily   Continuous Infusions: . dextrose 5 % and 0.9% NaCl 100 mL/hr at 11/21/20 0909  . magnesium sulfate bolus IVPB 4 g (11/21/20 1104)     LOS: 2 days    Time spent: 35 minutes    Ramiro Harvest, MD [facility]   To contact the attending provider between 7A-7P or the covering provider during after hours 7P-7A, please log into the web site [email or web address] and access using universal [place] password for that web site. If you do not have the password, please call the hospital operator.  11/21/2020, 11:06 AM

## 2020-11-21 NOTE — Evaluation (Signed)
Clinical/Bedside Swallow Evaluation Patient Details  Name: Troy PUSTEJOVSKY Sr. MRN: [id number] Date of Birth: September 02, 1924  Today's Date: 11/21/2020 Time: SLP Start Time (ACUTE ONLY): 1605 SLP Stop Time (ACUTE ONLY): 1648 SLP Time Calculation (min) (ACUTE ONLY): 43 min  Past Medical History:  Past Medical History:  Diagnosis Date  . Dementia (HCC)   . Depression   . DJD (degenerative joint disease)   . HTN (hypertension)    Past Surgical History: History reviewed. No pertinent surgical history. HPI:  87 yo male adm to [facility] with AMS, weakness, vomiting episode- dehydration.  Pt found to have AKI.  PMH + for dementia and pt resides at [facility], depression, DDD, HTN.  CT head 4/19 showed chronic atrophic and ischemic changes, CXR 4/19 hypoinflation.  .   Assessment / Plan / Recommendation Clinical Impression  Patient in bed, asleep, with daughter [place] [facility]) in room with pt.  He required max cues to awaken adequately to attempt po and continued stimulation to remain alert- including holding his own cup for self feeding.  Pt willing to consume intake including ice chips x2, Ensure via tsp, cup, straw- ice cream via tsp x3, and water via tsp and straw.  Pt's mentation is large limiting factor for safe po intake - and he is observed to swish liquids prior to initiating swallow.  Overt strong coughing noted after 2nd swallow of water via straw  - presumed due to premature spillage of water into airway.  No overt indication of aspiration with nectar, ice cream nor single ice chip.  Due to pt's AMS and concern for potential aspiration - will modify diet to decrease aspiration.     Did not test patient with solids due to his dysarthria, mentation and concern for aspiration.    Of note, upon discussion with daughter re: pt's premorbid status - she states pt has been "going down hill".  Educated her to aspiration precautions, clinical reasoning for dietary changes to mitigate aspiration *not  prevent it* and need to assure pt is fully alert if he is to consume po.  Using teach back, daughter educated - pt has dementia and demonstrated decreased sustained attention without ability to follow higher level conversation.   Recommended palliative consult to help pt's GOC in light of 87 advanced age, dementia and progressive decline - daughter reports she would be agreeable to a palliative meeting.  Pt also appears with right facial asymmetry - which daughter states is new since she can recall.  Note CT head negative.  SLP Visit Diagnosis: Dysphagia, oropharyngeal phase (R13.12)    Aspiration Risk  Moderate aspiration risk    Diet Recommendation Nectar-thick liquid (tsps thin water and single ice chip ok)   Medication Administration: Crushed with puree (try with ice cream or via IV) Supervision: Full supervision/cueing for compensatory strategies Compensations: Slow rate;Small sips/bites (have pt help self feed) Postural Changes: Remain upright for at least 30 minutes after po intake;Seated upright at 90 degrees    Other  Recommendations Oral Care Recommendations: Oral care BID   Follow up Recommendations    TBD    Frequency and Duration min 1 x/week  1 week       Prognosis    Guarded due to mentation, medical condition, advanced age    Swallow Study   General Date of Onset: 11/21/20 HPI: 87 yo male adm to [facility] with AMS, weakness, vomiting episode- dehydration.  Pt found to have AKI.  PMH + for dementia and pt resides at [facility], depression, DDD, HTN.  CT head 4/19 showed chronic atrophic and ischemic changes, CXR 4/19 hypoinflation.  . Type of Study: MBS-Modified Barium Swallow Study Diet Prior to this Study: Dysphagia 3 (soft);Thin liquids Temperature Spikes Noted: No Respiratory Status: Room air History of Recent Intubation: No Behavior/Cognition: Confused;Lethargic/Drowsy Oral Cavity Assessment: Within Functional Limits Oral Care Completed by SLP: No Oral Cavity -  Dentition: Adequate natural dentition Vision: Impaired for self-feeding Self-Feeding Abilities: Total assist Patient Positioning: Upright in bed Baseline Vocal Quality: Normal Volitional Cough: Cognitively unable to elicit Volitional Swallow: Unable to elicit    Oral/Motor/Sensory Function Overall Oral Motor/Sensory Function: Mild impairment Facial Symmetry: Abnormal symmetry right Facial Strength: Reduced right   Ice Chips Ice chips: Not tested   Thin Liquid Thin Liquid: Impaired Presentation: Cup;Straw;Spoon Oral Phase Functional Implications: Other (comment) (oral swishing observed) Pharyngeal  Phase Impairments: Cough - Immediate Other Comments: significant coughing episode noted    Nectar Thick Nectar Thick Liquid: Impaired Presentation: Straw;Cup;Self Fed Oral phase functional implications:  (swishing before swallow) Pharyngeal Phase Impairments: Cough - Delayed Other Comments: minimal subtle cough x1 of approx 10 boluses   Honey Thick Honey Thick Liquid: Not tested   Puree Puree: Impaired Presentation: Spoon Oral Phase Functional Implications: Prolonged oral transit Pharyngeal Phase Impairments: Suspected delayed Swallow Other Comments: ice cream   Solid     Solid: Not tested      Chales Abrahams 11/21/2020,5:33 PM  Rolena Infante, MS [facility] SLP Acute Rehab Services Office [phone] Pager [phone]

## 2020-11-21 NOTE — Progress Notes (Signed)
OT Cancellation Note  Patient Details Name: Troy DALTON Sr. MRN: [id number] DOB: September 27, 1924   Cancelled Treatment:    Reason Eval/Treat Not Completed: OT screened, no needs identified, will sign off. Patient total care and per PT note patient not able to meaningfully participate in therapy. OT will sign off. Of note MD cancelled one OT order but not the other. OT   [name] [name] 11/21/2020, 10:51 AM

## 2020-11-22 DIAGNOSIS — Z66 Do not resuscitate: Secondary | ICD-10-CM

## 2020-11-22 LAB — RENAL FUNCTION PANEL
Albumin: 2.6 g/dL — ABNORMAL LOW (ref 3.5–5.0)
Anion gap: 4 — ABNORMAL LOW (ref 5–15)
BUN: 26 mg/dL — ABNORMAL HIGH (ref 8–23)
CO2: 27 mmol/L (ref 22–32)
Calcium: 8.3 mg/dL — ABNORMAL LOW (ref 8.9–10.3)
Chloride: 112 mmol/L — ABNORMAL HIGH (ref 98–111)
Creatinine, Ser: 1.2 mg/dL (ref 0.61–1.24)
GFR, Estimated: 56 mL/min — ABNORMAL LOW (ref >60)
Glucose, Bld: 97 mg/dL (ref 70–99)
Phosphorus: 2.2 mg/dL — ABNORMAL LOW (ref 2.5–4.6)
Potassium: 3.3 mmol/L — ABNORMAL LOW (ref 3.5–5.1)
Sodium: 143 mmol/L (ref 135–145)

## 2020-11-22 LAB — CBC
HCT: 33.9 % — ABNORMAL LOW (ref 39.0–52.0)
Hemoglobin: 10.8 g/dL — ABNORMAL LOW (ref 13.0–17.0)
MCH: 31.3 pg (ref 26.0–34.0)
MCHC: 31.9 g/dL (ref 30.0–36.0)
MCV: 98.3 fL (ref 80.0–100.0)
Platelets: 159 10*3/uL (ref 150–400)
RBC: 3.45 MIL/uL — ABNORMAL LOW (ref 4.22–5.81)
RDW: 13.8 % (ref 11.5–15.5)
WBC: 5.7 10*3/uL (ref 4.0–10.5)
nRBC: 0 % (ref 0.0–0.2)

## 2020-11-22 LAB — MAGNESIUM: Magnesium: 2.1 mg/dL (ref 1.7–2.4)

## 2020-11-22 MED ORDER — POTASSIUM CHLORIDE 10 MEQ/100ML IV SOLN
INTRAVENOUS | Status: AC
  Filled 2020-11-22: qty 100

## 2020-11-22 MED ORDER — AMLODIPINE BESYLATE 5 MG PO TABS
5.0000 mg | ORAL_TABLET | Freq: Every day | ORAL | Status: DC
  Administered 2020-11-22: 5 mg via ORAL
  Filled 2020-11-22: qty 1

## 2020-11-22 MED ORDER — HYDRALAZINE HCL 20 MG/ML IJ SOLN
5.0000 mg | Freq: Once | INTRAMUSCULAR | Status: AC
  Administered 2020-11-22: 5 mg via INTRAVENOUS
  Filled 2020-11-22: qty 1

## 2020-11-22 MED ORDER — POTASSIUM CHLORIDE 10 MEQ/100ML IV SOLN
10.0000 meq | INTRAVENOUS | Status: AC
  Administered 2020-11-22 (×4): 10 meq via INTRAVENOUS
  Filled 2020-11-22 (×3): qty 100

## 2020-11-22 MED ORDER — RAMIPRIL 5 MG PO CAPS
5.0000 mg | ORAL_CAPSULE | Freq: Every day | ORAL | Status: DC
  Filled 2020-11-22 (×2): qty 1

## 2020-11-22 NOTE — Consult Note (Addendum)
Consultation Note Date: 11/22/2020   Patient Name: Troy HAMON Sr.  DOB: Dec 22, 1924  MRN: [id number]  Age / Sex: 87 y.o., male   PCP: Jacelyn Pi, NP Referring Physician: Flora Lipps, MD   REASON FOR CONSULTATION:Establishing goals of care  Palliative Care consult requested for goals of care discussion in this 87 y.o. male with a medical history significant for depression, hypertension, degenerative disc disease, and Alzheimer's dementia. Patient presented to ED from [facility] with complaints of worsening mental status compared to baseline. During work-up he was found to be severely dehydrated with AKI. Since admission SLP evaluated with recommendations for Nectar-thick liquid due to dysphagia and high risk for aspiration.   Clinical Assessment and Goals of Care: I have reviewed medical records including lab results, imaging, Epic notes, and MAR, received report from the bedside RN, and assessed the patient.   I met at the bedside with with [name] and his daughter, [name]) to discuss diagnosis prognosis, GOC, EOL wishes, disposition and options.  Patient is awake and alert to daughter. Watching television and laughing. Some confusion noted.   Patient is currently active with Outpatient Palliative ([facility]). I re-introduced Palliative Medicine as specialized medical care for people living with serious illness. It focuses on providing relief from the symptoms and stress of a serious illness. The goal is to improve quality of life for both the patient and the family. Daughter verbalized understanding and appreciation.   We discussed a brief life review of the patient, along with his functional and nutritional status. Daughter shares patient has been a resident at [facility] for the past year due to dementia. Prior to this he lived in the home alone. His wife of more than 65 years passed away 2 years ago. He has 2 children (son  lives in [place]). He is retired from the [employer] after serving 25 years. 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   Prior to admission [name] has been wheel chair bound due to frequent falls and weakness. He is alert to some situations. Requires assistance with ADLS. Daughter shares over the past 1-2 months patient has been sleeping more and generally does not take naps. He is also beginning to have difficulty feeding himself and holding his utensils and cups. Appetite has been good.   We discussed His current illness and what it means in the larger context of His on-going co-morbidities. We discussed at length dementia and it's progression, expectations, and signs, dysphagia, high risk of aspiration with potential complications and long-term outlook, including artificial feedings pros and cons. I provided recommendations against artificial feedings in patient given advanced age and dementia.  Natural disease trajectory and expectations at EOL were discussed.  [name] verbalizes her understanding of her father's current illness and co-morbidities.   A detailed discussion was had today regarding advanced directives.  Concepts specific to code status, artifical feeding and hydration, continued IV antibiotics and rehospitalization.   Patient does have an advanced directive naming Troy Poche (daughter) as his medical decision maker. Daughter shares he has not documented in depth wishes however she does agree with no forms of artificial feeding with understanding of risk. She shares goal is to follow recommendations from SLP and allow patient every opportunity to thrive with understanding of risk. In the event he worsens she and family would wish to focus on his comfort including feedings.  Detailed education provided on MOST form and daughter was provided with a copy to further review and discuss with her brother as requested. She is aware  document can be completed outpatient with Palliative or any of patient's providers as they wish. If she decides to complete prior to discharge she will notify myself or Palliative team. She expresses her wishes would be for DNR, no re-hospitalizations, focus on comfort in place, antibiotics for limited use, no IVF and no artificial feeding. She confirms again she would like to discuss with her brother and the only thing she could confirm is the no artificial feeding. Confirms DNR/DNI.  The difference between a aggressive medical intervention and a palliative comfort care path were discussed at length.   Values and goals of care important to patient and family were attempted to be elicited.   [name] is clear in her expressed goals to continue to treat the treatable, follow SLP recommendations, and allow [name] every opportunity to continue to thrive. If he was to further decline she would then wish to focus on his comfort and eliminate any suffering. She remains hopeful for some stability/improvement sharing his happy mood and more alertness today which is the father she is used to.    I discussed the importance of continued conversation with family and their medical providers regarding overall plan of care and treatment options, ensuring decisions are within the context of the patients values and GOCs.  Patient is currently active with outpatient palliative with [facility]. Daughter confirms wishes to continue with their support. Hospice and Palliative Care services outpatient were explained and educational material provided. Daughter verbalized her understanding and awareness of both palliative and hospice's goals and philosophy of care. She acknowledges her dad will most likely benefit from hospice in the near future and knows they may transition services at anytime.   Questions and concerns were addressed.  Hard Choices booklet left for review. The family was encouraged to call with questions  or concerns.  PMT will continue to support holistically as needed.   CODE STATUS: DNR  ADVANCE DIRECTIVES: Primary Decision Maker: Troy Poche (daughter/HCPOA)   SYMPTOM MANAGEMENT: per attending   Palliative Prophylaxis:   Aspiration, Delirium Protocol, Frequent Pain Assessment and Oral Care  PSYCHO-SOCIAL/SPIRITUAL:  Support System: Family  Desire for further Chaplaincy support:No   Additional Recommendations (Limitations, Scope, Preferences):  No Artificial Feeding and DNR/DNI, treat the treatable with no escalation of care  Education on hospice/palliative    PAST MEDICAL HISTORY: Past Medical History:  Diagnosis Date  . Dementia ([place])   . Depression   . DJD (degenerative joint disease)   . HTN (hypertension)     ALLERGIES:  has No Known Allergies.   MEDICATIONS:  Current Facility-Administered Medications  Medication Dose Route Frequency Provider Last Rate Last Admin  . acetaminophen (TYLENOL) tablet 650 mg  650 mg Oral Q6H PRN Elwyn Reach, MD   650 mg at 11/21/20 2134   Or  . acetaminophen (TYLENOL) suppository 650 mg  650 mg Rectal Q6H PRN Gala Romney L, MD      . amLODipine (NORVASC) tablet 5 mg  5 mg Oral Daily Pokhrel, Laxman, MD   5 mg at 11/22/20 1433  . Chlorhexidine Gluconate Cloth 2 % PADS 6 each  6 each Topical Daily Eugenie Filler, MD   6 each at 11/22/20 1208  . citalopram (CELEXA) tablet 5 mg  5 mg Oral QPC supper Eugenie Filler, MD   5 mg at 11/21/20 2138  .  cyanocobalamin ((VITAMIN B-12)) injection 1,000 mcg  1,000 mcg Subcutaneous Daily Eugenie Filler, MD   1,000 mcg at 11/22/20 1234  . feeding supplement (ENSURE ENLIVE / ENSURE PLUS) liquid 237 mL  237 mL Oral TID WC Eugenie Filler, MD   237 mL at 11/22/20 0819  . food thickener (SIMPLYTHICK) packet 1 packet  1 packet Oral PRN Eugenie Filler, MD      . ipratropium (ATROVENT) 0.03 % nasal spray 2 spray  2 spray Each Nare Q12H Eugenie Filler, MD   2 spray at  11/22/20 1233  . lip balm (CARMEX) ointment   Topical PRN Eugenie Filler, MD      . multivitamin with minerals tablet 1 tablet  1 tablet Oral QPC supper Eugenie Filler, MD   1 tablet at 11/21/20 1846  . ondansetron (ZOFRAN) tablet 4 mg  4 mg Oral Q6H PRN Elwyn Reach, MD       Or  . ondansetron (ZOFRAN) injection 4 mg  4 mg Intravenous Q6H PRN Jonelle Sidle, Mohammad L, MD      . potassium chloride 10 mEq in 100 mL IVPB  10 mEq Intravenous Q1 Hr x 4 Pokhrel, Laxman, MD 100 mL/hr at 11/22/20 1523 10 mEq at 11/22/20 1523  . QUEtiapine (SEROQUEL) tablet 12.5 mg  12.5 mg Oral QHS Eugenie Filler, MD   12.5 mg at 11/21/20 2134  . saccharomyces boulardii (FLORASTOR) capsule 250 mg  250 mg Oral BID AC Eugenie Filler, MD   250 mg at 11/22/20 1233    VITAL SIGNS: BP (!) 148/89   Pulse (!) 54   Temp 98 F (36.7 C)   Resp 20   Ht _0  (1.676 m)   Wt 74.6 kg   SpO2 98%   BMI 26.55 kg/m  Filed Weights   11/19/20 1846  Weight: 74.6 kg    Estimated body mass index is 26.55 kg/m as calculated from the following:   Height as of this encounter: _1  (1.676 m).   Weight as of this encounter: 74.6 kg.  LABS: CBC:    Component Value Date/Time   WBC 5.7 11/22/2020 0517   HGB 10.8 (L) 11/22/2020 0517   HCT 33.9 (L) 11/22/2020 0517   PLT 159 11/22/2020 0517   Comprehensive Metabolic Panel:    Component Value Date/Time   NA 143 11/22/2020 0517   K 3.3 (L) 11/22/2020 0517   BUN 26 (H) 11/22/2020 0517   CREATININE 1.20 11/22/2020 0517   ALBUMIN 2.6 (L) 11/22/2020 0517     Review of Systems  Unable to perform ROS: Dementia   Physical Exam General: NAD, chronically-ill appearing Cardiovascular: regular rate and rhythm Pulmonary:  diminished bilaterally  Abdomen: soft, nontender, + bowel sounds Extremities: no edema, no joint deformities Skin: no rashes, warm and dry Neurological: Alert to self and daughter, will follow commands, mood appropriate, laughing     Prognosis: Guarded-Poor in the setting of advanced Alzheimer's, depression, hypertension, dehydration, dysphagia, high risk of aspiration  Discharge Planning:  Return to [facility] with continued Palliative support (active with [facility])  Recommendations: . DNR/DNI-as confirmed by daughter, education provided on MOST form. Daughter request to review with her brother and complete outpatient. Will notify PMT team if decides to complete in house. NO ARTIFICIAL FEEDING TUBES . Continue with current plan of care  . Daughter, [name] is clear in expressed goals to continue to treat the treatable. Family remains hopeful for some stability, however realistic and  planning for further decline. When this occur will become more open to focusing on his comfort. No interested in multiple hospitalizations. . Patient is active with Palliative ([facility]). Daughter wishes to continue with their ongoing support with plans to transition to hospice in the near future.  Marland Kitchen PMT will continue to support and follow as needed. Please call team line with urgent needs.   Palliative Performance Scale: PPS 30%              Daughter expressed understanding and was in agreement with this plan.   Thank you for allowing the Palliative Medicine Team to assist in the care of this patient. Please utilize secure chat with additional questions, if there is no response within 30 minutes please call the above phone number.   Time In: 1400 Time Out: 1510 Time Total: 65 min.   Visit consisted of counseling and education dealing with the complex and emotionally intense issues of symptom management and palliative care in the setting of serious and potentially life-threatening illness.Greater than 50%  of this time was spent counseling and coordinating care related to the above assessment and plan.  Signed by:  Alda Lea, AGPCNP-BC Palliative Medicine Team  Phone: [phone] Pager:  [phone] Amion: [place] Team providers are available by phone from 7am to 7pm daily and can be reached through the team cell phone.  Should this patient require assistance outside of these hours, please call the patient's attending physician.

## 2020-11-22 NOTE — Progress Notes (Signed)
PROGRESS NOTE    Troy GRIESER Sr.  OJJ:[id number] DOB: June 09, 1925 [name] PCP: Sunday Spillers, [name]   Brief Narrative:  Patient is a 87 year old male with past medical history of advanced Alzheimer's dementia in memory care unit, depression, hypertension presented to the hospital brought in by her family with worsening mental status.  At baseline patient is mostly wheelchair-bound.  In the ED patient was noted to be severely dehydrated with acute kidney injury and was oliguric.  There was also concern for possible aspiration.  Patient was then admitted to hospital for further evaluation and treatment.   Assessment & Plan:   Principal Problem:   AKI (acute kidney injury) (HCC) Active Problems:   Dementia (HCC)   Essential hypertension   Hyperkalemia   Leucocytosis   Dehydration   Diarrhea   Hypokalemia  Acute kidney injury Presented with creatinine of 3.8.  Current creatinine of 1.2.  Patient was volume depleted on presentation since was on diuretics and ACE inhibitor's.  Foley catheter was placed initially which we will discontinue today.  Renal ultrasound with renal cyst.  Avoid nephrotoxins.  Will consider discontinuation of IV fluids today.  Encourage oral intake   Volume depletion. Initially received IV fluid hydration.  Improved.  Will increase oral hydration.-   Hyperkalemia Initially received Lokelma.  Mildly hypokalemic today.  Will replace  Anemia of chronic disease.    Latest hemoglobin of 10.8 and is stable.  Transfuse for hemoglobin less than 7.  Severe Alzheimer's dementia Celexa has been decreased to 5 mg as per family who want it to be weaned off.  Seroquel decreased to 12.5 mg at nighttime.  Patient has issues with dysphagia secondary to dementia.  Speech therapy on board.  Palliative care has been consulted for goals of care as per the family wishes.    Essential hypertension Was volume lability on presentation.  Diuretics on hold including ACE  inhibitor.  Received IV fluid hydration.  Will consider amlodipine at this time since blood pressure is starting to rise.  Leukocytosis Patient is afebrile likely reactive leukocytosis.  Has resolved at this time.    Hypokalemia/hypomagnesemia Received magnesium sulfate and potassium.  We will repeat potassium supplement again today with IV KCl..  Check levels in a.m.  Diarrhea No further episodes reported.   Dysphagia likely secondary to dementia. On dysphagia 3 diet.  Speech therapy on board.   DVT prophylaxis:  SCDs  Code Status:  DNR  Family Communication:  None today.    Disposition:   Status is: Inpatient  Dispo: The patient is from: Memory care unit.              Anticipated d/c is to: Back to memory care unit , pending palliative care input              Patient currently stable for discharge.   Difficult to place patient: no   Consultants:   None  Procedures:   CT head Schmidt  Chest x-ray Schmidt  Antimicrobials:   None   Subjective: Today, patient was seen and examined at bedside.  Denies any nausea, vomiting shortness of breath but has mild cough.  Poor historian  Objective: Vitals:   11/20/20 2030 11/21/20 0446 11/21/20 2059 11/22/20 0415  BP: (!) 142/78 140/74 (!) 127/100 (!) 151/78  Pulse: (!) 56 (!) 108 77 (!) 54  Resp: 18 20 18 20   Temp: 98 F (36.7 C) 98 F (36.7 C) 98.4 F (36.9 C) 98 F (36.7  C)  TempSrc:      SpO2: 96% 97% 97% 98%  Weight:      Height:        Intake/Output Summary (Last 24 hours) at 11/22/2020 1203 Last data filed at 11/22/2020 0600 Gross per 24 hour  Intake 1110 ml  Output 1175 ml  Net -65 ml   Filed Weights   11/19/20 1846  Weight: 74.6 kg   Physical examination: General:  Average built, not in obvious distress HENT:   No scleral pallor or icterus noted. Oral mucosa is mildly dry Chest:   Diminished breath sounds bilaterally. No crackles or wheezes.  CVS: S1 &S2 heard. No murmur.  Regular rate  and rhythm. Abdomen: Soft, nontender, nondistended.  Bowel sounds are heard.   Extremities: No cyanosis, clubbing or edema.  Peripheral pulses are palpable. Psych: Alert, awake and communicative, disoriented. CNS:  No cranial nerve deficits.  Power equal in all extremities.   Skin: Warm and dry.  No rashes noted.  Data Reviewed: I have personally reviewed following labs and imaging studies  CBC: Recent Labs  Lab 11/19/20 1857 11/20/20 0503 11/21/20 0550 11/22/20 0517  WBC 19.4* 14.3* 7.5 5.7  NEUTROABS 16.9* 11.8* 5.6  --   HGB 12.7* 10.8* 10.3* 10.8*  HCT 38.7* 33.1* 33.1* 33.9*  MCV 95.3 95.7 99.4 98.3  PLT 160 143* 143* 159    Basic Metabolic Panel: Recent Labs  Lab 11/19/20 1857 11/20/20 0437 11/21/20 0550 11/22/20 0517  NA 137 138 140 143  K 5.6* 4.1 3.0* 3.3*  CL 104 104 108 112*  CO2 22 25 25 27   GLUCOSE 127* 120* 89 97  BUN 55* 59* 40* 26*  CREATININE 3.82* 3.39* 1.60* 1.20  CALCIUM 8.6* 8.3* 8.1* 8.3*  MG  --   --  1.5* 2.1  PHOS  --   --   --  2.2*    GFR: Estimated Creatinine Clearance: 34.7 mL/min (by C-G formula based on SCr of 1.2 mg/dL).  Liver Function Tests: Recent Labs  Lab 11/19/20 1857 11/20/20 0437 11/21/20 0550 11/22/20 0517  AST 26 20 18   --   ALT 7 11 14   --   ALKPHOS 45 37* 32*  --   BILITOT 1.9* 1.3* 0.9  --   PROT 6.4* 5.5* 5.1*  --   ALBUMIN 3.5 2.8* 2.7* 2.6*    CBG: Recent Labs  Lab 11/19/20 1943  GLUCAP 129*     Recent Results (from the past 240 hour(s))  Resp Panel by RT-PCR (Flu A&B, Covid) Nasopharyngeal Swab     Status: None   Collection Time: 11/19/20  8:22 PM   Specimen: Nasopharyngeal Swab; Nasopharyngeal([name]) swabs in vial transport medium  Result Value Ref Range Status   SARS Coronavirus 2 by RT PCR NEGATIVE NEGATIVE Final    Comment: (NOTE) SARS-CoV-2 target nucleic acids are NOT DETECTED.  The SARS-CoV-2 RNA is generally detectable in upper respiratory specimens during the acute phase of infection.  The lowest concentration of SARS-CoV-2 viral copies this assay can detect is 138 copies/mL. A negative result does not preclude SARS-Cov-2 infection and should not be used as the sole basis for treatment or other patient management decisions. A negative result may occur with  improper specimen collection/handling, submission of specimen other than nasopharyngeal swab, presence of viral mutation(s) within the areas targeted by this assay, and inadequate number of viral copies(<138 copies/mL). A negative result must be combined with clinical observations, patient history, and epidemiological information. The expected result is Negative.  Fact Sheet for Patients:  BloggerCourse.com  Fact Sheet for Healthcare Providers:  SeriousBroker.it  This test is no t yet approved or cleared by the [place] FDA and  has been authorized for detection and/or diagnosis of SARS-CoV-2 by FDA under an Emergency Use Authorization (EUA). This EUA will remain  in effect (meaning this test can be used) for the duration of the COVID-19 declaration under Section 564(b)(1) of the Act, 21 U.S.C.section 360bbb-3(b)(1), unless the authorization is terminated  or revoked sooner.       Influenza A by PCR NEGATIVE NEGATIVE Final   Influenza B by PCR NEGATIVE NEGATIVE Final    Comment: (NOTE) The Xpert Xpress SARS-CoV-2/FLU/RSV plus assay is intended as an aid in the diagnosis of influenza from Nasopharyngeal swab specimens and should not be used as a sole basis for treatment. Nasal washings and aspirates are unacceptable for Xpert Xpress SARS-CoV-2/FLU/RSV testing.  Fact Sheet for Patients: BloggerCourse.com  Fact Sheet for Healthcare Providers: SeriousBroker.it  This test is not yet approved or cleared by the [place] FDA and has been authorized for detection and/or diagnosis of SARS-CoV-2 by FDA  under an Emergency Use Authorization (EUA). This EUA will remain in effect (meaning this test can be used) for the duration of the COVID-19 declaration under Section 564(b)(1) of the Act, 21 U.S.C. section 360bbb-3(b)(1), unless the authorization is terminated or revoked.  Performed at [facility], [address] [place], [place] [address]      Radiology Studies: US RENAL  Result Date: 11/20/2020 CLINICAL DATA:  Acute renal injury EXAM: RENAL / URINARY TRACT ULTRASOUND COMPLETE COMPARISON:  CT from 05/19/2019 FINDINGS: Right Kidney: Renal measurements: 10.9 x 4.6 x 5.1 cm. = volume: 135 mL. No mass lesion or hydronephrosis is noted. Cysts are seen within the right kidney the largest of which measures 2.9 cm. These are roughly stable from the prior CT examination. Left Kidney: Renal measurements: 11.4 x 6.2 x 6.0 cm. = volume: 224 mL. Multiple cysts are noted similar to that seen on the right. The largest of these measures 4.2 cm in the midportion of the left kidney. No mass lesion or hydronephrosis is noted. Bladder: Decompressed by Foley catheter. Other: None. IMPRESSION: Bilateral simple appearing renal cysts. No other focal abnormality is noted. Electronically Signed   By: Alcide Clever M.D.   On: 11/20/2020 20:02     Scheduled Meds: . Chlorhexidine Gluconate Cloth  6 each Topical Daily  . citalopram  5 mg Oral QPC supper  . cyanocobalamin  1,000 mcg Subcutaneous Daily  . feeding supplement  237 mL Oral TID WC  . ipratropium  2 spray Each Nare Q12H  . multivitamin with minerals  1 tablet Oral QPC supper  . QUEtiapine  12.5 mg Oral QHS  . saccharomyces boulardii  250 mg Oral BID AC   Continuous Infusions: . dextrose 5 % and 0.9% NaCl 100 mL/hr at 11/21/20 0909     LOS: 3 days    Joycelyn Das, MD [facility] 11/22/2020, 12:03 PM

## 2020-11-23 LAB — BASIC METABOLIC PANEL
Anion gap: 8 (ref 5–15)
BUN: 21 mg/dL (ref 8–23)
CO2: 24 mmol/L (ref 22–32)
Calcium: 8.9 mg/dL (ref 8.9–10.3)
Chloride: 112 mmol/L — ABNORMAL HIGH (ref 98–111)
Creatinine, Ser: 1.2 mg/dL (ref 0.61–1.24)
GFR, Estimated: 56 mL/min — ABNORMAL LOW (ref >60)
Glucose, Bld: 112 mg/dL — ABNORMAL HIGH (ref 70–99)
Potassium: 3.6 mmol/L (ref 3.5–5.1)
Sodium: 144 mmol/L (ref 135–145)

## 2020-11-23 LAB — CBC
HCT: 39.9 % (ref 39.0–52.0)
Hemoglobin: 12.6 g/dL — ABNORMAL LOW (ref 13.0–17.0)
MCH: 31 pg (ref 26.0–34.0)
MCHC: 31.6 g/dL (ref 30.0–36.0)
MCV: 98 fL (ref 80.0–100.0)
Platelets: 186 10*3/uL (ref 150–400)
RBC: 4.07 MIL/uL — ABNORMAL LOW (ref 4.22–5.81)
RDW: 13.6 % (ref 11.5–15.5)
WBC: 10.8 10*3/uL — ABNORMAL HIGH (ref 4.0–10.5)
nRBC: 0 % (ref 0.0–0.2)

## 2020-11-23 LAB — MAGNESIUM: Magnesium: 1.7 mg/dL (ref 1.7–2.4)

## 2020-11-23 LAB — PHOSPHORUS: Phosphorus: 2 mg/dL — ABNORMAL LOW (ref 2.5–4.6)

## 2020-11-23 MED ORDER — POTASSIUM PHOSPHATES 15 MMOLE/5ML IV SOLN
20.0000 mmol | Freq: Once | INTRAVENOUS | Status: AC
  Administered 2020-11-23: 20 mmol via INTRAVENOUS
  Filled 2020-11-23: qty 6.67

## 2020-11-23 MED ORDER — HYDRALAZINE HCL 20 MG/ML IJ SOLN: 10.0000 mg | Freq: Once | INTRAMUSCULAR | Status: DC

## 2020-11-23 MED ORDER — LACTATED RINGERS IV SOLN: INTRAVENOUS | Status: DC

## 2020-11-23 MED ORDER — RAMIPRIL 5 MG PO CAPS
5.0000 mg | ORAL_CAPSULE | Freq: Every day | ORAL | Status: DC
  Filled 2020-11-23 (×2): qty 1

## 2020-11-23 MED ORDER — AMLODIPINE BESYLATE 5 MG PO TABS
5.0000 mg | ORAL_TABLET | Freq: Every day | ORAL | Status: DC
  Administered 2020-11-25 – 2020-11-27 (×3): 5 mg via ORAL
  Filled 2020-11-23 (×4): qty 1

## 2020-11-23 MED ORDER — AMLODIPINE BESYLATE 10 MG PO TABS: 10.0000 mg | ORAL_TABLET | Freq: Every day | ORAL | Status: DC

## 2020-11-23 MED ORDER — CARVEDILOL 6.25 MG PO TABS
6.2500 mg | ORAL_TABLET | Freq: Two times a day (BID) | ORAL | Status: DC
  Administered 2020-11-23 – 2020-11-27 (×5): 6.25 mg via ORAL
  Filled 2020-11-23 (×6): qty 1

## 2020-11-23 MED ORDER — TAMSULOSIN HCL 0.4 MG PO CAPS
0.4000 mg | ORAL_CAPSULE | Freq: Every day | ORAL | Status: DC
  Administered 2020-11-23 – 2020-11-26 (×4): 0.4 mg via ORAL
  Filled 2020-11-23 (×4): qty 1

## 2020-11-23 MED ORDER — HYDRALAZINE HCL 20 MG/ML IJ SOLN
10.0000 mg | Freq: Four times a day (QID) | INTRAMUSCULAR | Status: DC | PRN
  Administered 2020-11-25 – 2020-11-27 (×2): 10 mg via INTRAVENOUS
  Filled 2020-11-23 (×3): qty 1

## 2020-11-23 MED ORDER — HYDRALAZINE HCL 20 MG/ML IJ SOLN
10.0000 mg | Freq: Three times a day (TID) | INTRAMUSCULAR | Status: DC | PRN
  Administered 2020-11-23: 10 mg via INTRAVENOUS
  Filled 2020-11-23: qty 1

## 2020-11-23 MED ORDER — RAMIPRIL 10 MG PO CAPS: 10.0000 mg | ORAL_CAPSULE | Freq: Every day | ORAL | Status: DC

## 2020-11-23 NOTE — Progress Notes (Signed)
PROGRESS NOTE    Troy BELLANCA Sr.  FBP:[id number] DOB: November 11, 1924 DOA: 11/19/2020 PCP: Sunday Spillers, NP   Brief Narrative:  Patient is a 87 year old male with past medical history of advanced Alzheimer's dementia in memory care unit, depression, hypertension presented to the hospital brought in by her family with worsening mental status.  At baseline, patient is mostly wheelchair-bound.  In the ED, patient was noted to be severely dehydrated with acute kidney injury and was oliguric.  There was also concern for possible aspiration.  Patient was then admitted to hospital for further evaluation and treatment.   Assessment & Plan:   Principal Problem:   AKI (acute kidney injury) (HCC) Active Problems:   Dementia (HCC)   Essential hypertension   Hyperkalemia   Leucocytosis   Dehydration   Diarrhea   Hypokalemia  Acute kidney injury Presented with creatinine of 3.8.  Current creatinine of 1.2.  Patient was volume depleted on presentation since was on diuretics and ACE inhibitor's.  Off Foley catheter.  Renal ultrasound with renal cyst.  Avoid nephrotoxins.  Off IV fluids.  Encourage oral intake.  Plan to discontinue HCTZ on discharge.  Volume depletion. Initially received IV fluid hydration.  Improved.  Encourage oral hydration.  Hyperkalemia Initially received Lokelma.  Subsequently was hypokalemiac.  Received potassium supplements yesterday.  Potassium 3.6 today.  Anemia of chronic disease.    Latest hemoglobin of 10.8 and is stable.  Transfuse for hemoglobin less than 7.  Severe Alzheimer's dementia Celexa has been decreased to 5 mg as per family who want it to be weaned off.  Seroquel decreased to 12.5 mg at nighttime.  Patient has issues with dysphagia secondary to dementia.  Speech therapy on board.  Palliative care has been consulted for goals of care as per the family wishes.    Essential hypertension Patient was volume depleted on presentation has been taken off  hydrochlorothiazide.  Resume rest of home medication.  Add amlodipine.  Add as needed hydralazine.  Blood pressure is elevated today.  We will continue to monitor.    Leukocytosis Patient is afebrile likely reactive leukocytosis.  WBC 15.8.  Hypokalemia/hypomagnesemia Received magnesium sulfate and potassium.  Improved at this time.  Hypophosphatemia.  We will replace through IV.  Diarrhea No further episodes reported.   Dysphagia likely secondary to dementia. On full liquids with nectar thick consistency diet.  Speech therapy on board.   DVT prophylaxis:  SCDs  Code Status:  DNR  Family Communication:  I spoke with the patient's daughter and updated her about the clinical condition of the patient.  Disposition:   Status is: Inpatient  Dispo: The patient is from: Memory care unit.              Anticipated d/c is to: Back to memory care unit likely by tomorrow.              Patient currently not stable for discharge.   Difficult to place patient: no   Consultants:   None  Procedures:   CT head 11/19/2020  Chest x-ray 11/19/2020  Antimicrobials:   None   Subjective: Today, patient was seen and examined bedside.  Foley catheter has been removed yesterday.  No further urethral bleeding.  Nursing staff reported elevated blood pressure.  Patient is a poor historian.  Complains of mild coughing.  Objective: Vitals:   11/22/20 2152 11/22/20 2304 11/23/20 0537 11/23/20 0644  BP: (!) 159/104 (!) 145/128 (!) 192/101 (!) 194/113  Pulse:  82 83  Resp:   18   Temp:   98.2 F (36.8 C)   TempSrc:      SpO2:   99%   Weight:      Height:        Intake/Output Summary (Last 24 hours) at 11/23/2020 1103 Last data filed at 11/23/2020 0600 Gross per 24 hour  Intake 681 ml  Output 250 ml  Net 431 ml   Filed Weights   11/19/20 1846  Weight: 74.6 kg   Physical examination: General:  Average built, not in obvious distress, alert awake communicative HENT:   No scleral  pallor or icterus noted. Oral mucosa is mildly dry Chest:   Diminished breath sounds bilaterally.  Coarse breath sounds noted. CVS: S1 &S2 heard. No murmur.  Regular rate and rhythm. Abdomen: Soft, nontender, nondistended.  Bowel sounds are heard.  Condom catheter in place. Extremities: No cyanosis, clubbing or edema.  Peripheral pulses are palpable. Psych: Alert, awake and communicative, disoriented. CNS: Moves all extremities, Skin: Warm and dry.  No rashes noted.  Data Reviewed: I have personally reviewed following labs and imaging studies  CBC: Recent Labs  Lab 11/19/20 1857 11/20/20 0503 11/21/20 0550 11/22/20 0517 11/23/20 0518  WBC 19.4* 14.3* 7.5 5.7 10.8*  NEUTROABS 16.9* 11.8* 5.6  --   --   HGB 12.7* 10.8* 10.3* 10.8* 12.6*  HCT 38.7* 33.1* 33.1* 33.9* 39.9  MCV 95.3 95.7 99.4 98.3 98.0  PLT 160 143* 143* 159 186    Basic Metabolic Panel: Recent Labs  Lab 11/19/20 1857 11/20/20 0437 11/21/20 0550 11/22/20 0517 11/23/20 0518  NA 137 138 140 143 144  K 5.6* 4.1 3.0* 3.3* 3.6  CL 104 104 108 112* 112*  CO2 22 25 25 27 24   GLUCOSE 127* 120* 89 97 112*  BUN 55* 59* 40* 26* 21  CREATININE 3.82* 3.39* 1.60* 1.20 1.20  CALCIUM 8.6* 8.3* 8.1* 8.3* 8.9  MG  --   --  1.5* 2.1 1.7  PHOS  --   --   --  2.2* 2.0*    GFR: Estimated Creatinine Clearance: 34.7 mL/min (by C-G formula based on SCr of 1.2 mg/dL).  Liver Function Tests: Recent Labs  Lab 11/19/20 1857 11/20/20 0437 11/21/20 0550 11/22/20 0517  AST 26 20 18   --   ALT 7 11 14   --   ALKPHOS 45 37* 32*  --   BILITOT 1.9* 1.3* 0.9  --   PROT 6.4* 5.5* 5.1*  --   ALBUMIN 3.5 2.8* 2.7* 2.6*    CBG: Recent Labs  Lab 11/19/20 1943  GLUCAP 129*     Recent Results (from the past 240 hour(s))  Resp Panel by RT-PCR (Flu A&B, Covid) Nasopharyngeal Swab     Status: None   Collection Time: 11/19/20  8:22 PM   Specimen: Nasopharyngeal Swab; Nasopharyngeal(NP) swabs in vial transport medium  Result  Value Ref Range Status   SARS Coronavirus 2 by RT PCR NEGATIVE NEGATIVE Final    Comment: (NOTE) SARS-CoV-2 target nucleic acids are NOT DETECTED.  The SARS-CoV-2 RNA is generally detectable in upper respiratory specimens during the acute phase of infection. The lowest concentration of SARS-CoV-2 viral copies this assay can detect is 138 copies/mL. A negative result does not preclude SARS-Cov-2 infection and should not be used as the sole basis for treatment or other patient management decisions. A negative result may occur with  improper specimen collection/handling, submission of specimen other than nasopharyngeal swab, presence of viral  mutation(s) within the areas targeted by this assay, and inadequate number of viral copies(<138 copies/mL). A negative result must be combined with clinical observations, patient history, and epidemiological information. The expected result is Negative.  Fact Sheet for Patients:  BloggerCourse.com  Fact Sheet for Healthcare Providers:  SeriousBroker.it  This test is no t yet approved or cleared by the [place] FDA and  has been authorized for detection and/or diagnosis of SARS-CoV-2 by FDA under an Emergency Use Authorization (EUA). This EUA will remain  in effect (meaning this test can be used) for the duration of the COVID-19 declaration under Section 564(b)(1) of the Act, 21 U.S.C.section 360bbb-3(b)(1), unless the authorization is terminated  or revoked sooner.       Influenza A by PCR NEGATIVE NEGATIVE Final   Influenza B by PCR NEGATIVE NEGATIVE Final    Comment: (NOTE) The Xpert Xpress SARS-CoV-2/FLU/RSV plus assay is intended as an aid in the diagnosis of influenza from Nasopharyngeal swab specimens and should not be used as a sole basis for treatment. Nasal washings and aspirates are unacceptable for Xpert Xpress SARS-CoV-2/FLU/RSV testing.  Fact Sheet for  Patients: BloggerCourse.com  Fact Sheet for Healthcare Providers: SeriousBroker.it  This test is not yet approved or cleared by the [place] FDA and has been authorized for detection and/or diagnosis of SARS-CoV-2 by FDA under an Emergency Use Authorization (EUA). This EUA will remain in effect (meaning this test can be used) for the duration of the COVID-19 declaration under Section 564(b)(1) of the Act, 21 U.S.C. section 360bbb-3(b)(1), unless the authorization is terminated or revoked.  Performed at [facility], [address], [place], [place] [address]      Radiology Studies: No results found.   Scheduled Meds: . amLODipine  10 mg Oral Daily  . carvedilol  6.25 mg Oral BID WC  . Chlorhexidine Gluconate Cloth  6 each Topical Daily  . citalopram  5 mg Oral QPC supper  . cyanocobalamin  1,000 mcg Subcutaneous Daily  . feeding supplement  237 mL Oral TID WC  . ipratropium  2 spray Each Nare Q12H  . multivitamin with minerals  1 tablet Oral QPC supper  . QUEtiapine  12.5 mg Oral QHS  . [START ON 11/24/2020] ramipril  10 mg Oral Daily  . saccharomyces boulardii  250 mg Oral BID AC   Continuous Infusions:    LOS: 4 days    Joycelyn Das, MD [facility] 11/23/2020, 11:03 AM

## 2020-11-23 NOTE — Progress Notes (Signed)
[facility] ([facility]) Community Based Palliative Care       This patient is enrolled in our palliative care services in the community.  [facility] will continue to follow for any discharge planning needs and to coordinate continuation of palliative care.       Thank you for the opportunity to participate in this patient's care.     Chrislyn King, BSN, RN [facility] Hospital Liaison   [phone] (24 h on call)  

## 2020-11-24 LAB — BASIC METABOLIC PANEL
Anion gap: 7 (ref 5–15)
BUN: 25 mg/dL — ABNORMAL HIGH (ref 8–23)
CO2: 27 mmol/L (ref 22–32)
Calcium: 8.7 mg/dL — ABNORMAL LOW (ref 8.9–10.3)
Chloride: 110 mmol/L (ref 98–111)
Creatinine, Ser: 1.38 mg/dL — ABNORMAL HIGH (ref 0.61–1.24)
GFR, Estimated: 48 mL/min — ABNORMAL LOW (ref >60)
Glucose, Bld: 95 mg/dL (ref 70–99)
Potassium: 3.8 mmol/L (ref 3.5–5.1)
Sodium: 144 mmol/L (ref 135–145)

## 2020-11-24 LAB — CBC
HCT: 33.9 % — ABNORMAL LOW (ref 39.0–52.0)
Hemoglobin: 10.6 g/dL — ABNORMAL LOW (ref 13.0–17.0)
MCH: 31 pg (ref 26.0–34.0)
MCHC: 31.3 g/dL (ref 30.0–36.0)
MCV: 99.1 fL (ref 80.0–100.0)
Platelets: 167 10*3/uL (ref 150–400)
RBC: 3.42 MIL/uL — ABNORMAL LOW (ref 4.22–5.81)
RDW: 13.8 % (ref 11.5–15.5)
WBC: 10 10*3/uL (ref 4.0–10.5)
nRBC: 0 % (ref 0.0–0.2)

## 2020-11-24 LAB — PHOSPHORUS: Phosphorus: 3.4 mg/dL (ref 2.5–4.6)

## 2020-11-24 LAB — MAGNESIUM: Magnesium: 1.7 mg/dL (ref 1.7–2.4)

## 2020-11-24 MED ORDER — FINASTERIDE 5 MG PO TABS
5.0000 mg | ORAL_TABLET | Freq: Every day | ORAL | Status: DC
  Administered 2020-11-24 – 2020-11-27 (×4): 5 mg via ORAL
  Filled 2020-11-24 (×4): qty 1

## 2020-11-24 MED ORDER — FLUTICASONE PROPIONATE 50 MCG/ACT NA SUSP
1.0000 | Freq: Every day | NASAL | Status: DC
  Administered 2020-11-24 – 2020-11-27 (×4): 1 via NASAL
  Filled 2020-11-24: qty 16

## 2020-11-24 MED ORDER — BISACODYL 5 MG PO TBEC
10.0000 mg | DELAYED_RELEASE_TABLET | Freq: Every day | ORAL | Status: DC
  Administered 2020-11-24 – 2020-11-27 (×4): 10 mg via ORAL
  Filled 2020-11-24 (×4): qty 2

## 2020-11-24 MED ORDER — ALBUTEROL SULFATE (2.5 MG/3ML) 0.083% IN NEBU
2.5000 mg | INHALATION_SOLUTION | RESPIRATORY_TRACT | Status: DC | PRN
  Administered 2020-11-24 – 2020-11-25 (×2): 2.5 mg via RESPIRATORY_TRACT
  Filled 2020-11-24 (×2): qty 3

## 2020-11-24 MED ORDER — GUAIFENESIN-DM 100-10 MG/5ML PO SYRP
5.0000 mL | ORAL_SOLUTION | ORAL | Status: DC | PRN
  Administered 2020-11-26: 5 mL via ORAL
  Filled 2020-11-24 (×3): qty 5

## 2020-11-24 MED ORDER — POLYETHYLENE GLYCOL 3350 17 G PO PACK
17.0000 g | PACK | Freq: Every day | ORAL | Status: DC
  Administered 2020-11-24 – 2020-11-27 (×4): 17 g via ORAL
  Filled 2020-11-24 (×4): qty 1

## 2020-11-24 NOTE — TOC Initial Note (Addendum)
Transition of Care (TOC) - Initial/Assessment Note    Patient Details  Name: Troy HENNINGSEN Sr. MRN: [id number] Date of Birth: 1925-06-08  Transition of [facility]) CM/SW Contact:    Trish Mage, LCSW Phone Number: 11/24/2020, 10:34 AM  Clinical Narrative:   Patient who is stable for d/c has a new Foley. Spoke with Barnett Applebaum at [facility] who stated the person that could make the decision about resident with Foley is not in today, and they do not do weekend admissions.  MD alerted.  Will call back on Monday to see where things stand. TOC will continue to follow during the course of hospitalization.  Addendum:  Met with daughter bedside and gave her above information.  She is still contemplating comfort care, but has no made a firm decision yet.                  Expected Discharge Plan: Assisted Living Barriers to Discharge: Other (comment) (No weekend admissions/ patient has new Foley)   Patient Goals and CMS Choice        Expected Discharge Plan and Services Expected Discharge Plan: Assisted Living   Discharge Planning Services: CM Consult   Living arrangements for the past 2 months: [place]                                      Prior Living Arrangements/Services Living arrangements for the past 2 months: [place] Lives with:: Facility Resident Patient language and need for interpreter reviewed:: Yes        Need for Family Participation in Patient Care: Yes (Comment) Care giver support system in place?: Yes (comment)   Criminal Activity/Legal Involvement Pertinent to Current Situation/Hospitalization: No - Comment as needed  Activities of Daily Living Home Assistive Devices/Equipment: Wheelchair ADL Screening (condition at time of admission) Patient's cognitive ability adequate to safely complete daily activities?: No Is the patient deaf or have difficulty hearing?: Yes Does the patient have difficulty seeing, even when  wearing glasses/contacts?: No Does the patient have difficulty concentrating, remembering, or making decisions?: Yes Patient able to express need for assistance with ADLs?: No Does the patient have difficulty dressing or bathing?: Yes Independently performs ADLs?: No Communication: Independent Dressing (OT): Dependent Is this a change from baseline?: Pre-admission baseline Grooming: Dependent Is this a change from baseline?: Pre-admission baseline Feeding: Needs assistance Is this a change from baseline?: Pre-admission baseline Bathing: Dependent Is this a change from baseline?: Pre-admission baseline Toileting: Dependent Is this a change from baseline?: Pre-admission baseline In/Out Bed: Dependent Is this a change from baseline?: Pre-admission baseline Walks in Home: Dependent Is this a change from baseline?: Pre-admission baseline Does the patient have difficulty walking or climbing stairs?: Yes Weakness of Legs: Both Weakness of Arms/Hands: Both  Permission Sought/Granted Permission sought to share information with : Family Supports Permission granted to share information with : Yes, Verbal Permission Granted  Share Information with NAME: Lanna Poche (Daughter)   [phone]           Emotional Assessment       Orientation: : Oriented to Self Alcohol / Substance Use: Not Applicable Psych Involvement: No (comment)  Admission diagnosis:  AKI (acute kidney injury) ([place]) [N17.9] Patient Active Problem List   Diagnosis Date Noted  . Diarrhea 11/21/2020  . Hypokalemia 11/21/2020  . AKI (acute kidney injury) ([place]) 11/19/2020  . Dementia ([place]) 11/19/2020  .  Essential hypertension 11/19/2020  . Hyperkalemia 11/19/2020  . Leucocytosis 11/19/2020  . Dehydration 11/19/2020   PCP:  Jacelyn Pi, NP Pharmacy:  No Pharmacies Listed    Social Determinants of Health (SDOH) Interventions    Readmission Risk Interventions No flowsheet data found.

## 2020-11-24 NOTE — Progress Notes (Signed)
  Speech Language Pathology Treatment: Dysphagia  Patient Details Name: Troy PATTY Sr. MRN: [id number] DOB: 1924/08/11 Today's Date: 11/24/2020 Time: 5456-2563 SLP Time Calculation (min) (ACUTE ONLY): 10 min  Assessment / Plan / Recommendation Clinical Impression  Pt found in bed, asleep with mouth agape. Daughter, Chrys Racer, reports pt slept most of the day yesterday. She states that he will likely dc with hospice/pallative care and she has been informed that the facility will manage his diet modification.  Chrys Racer states pt has consumed "some", advised her to noted concern for aspiration risk and mitigation strategies in place.  Recommended for institutionalized feeding that pt continue nectar thickened liquids - and consider creamy purees.  However when family desires to bring pt soft food - recommend allow with strategies for oral clearance - including using puree, in lieu of liquid,  to help orally transit solid that pt has not swallowed.  Daughter reports pt with excessive oral holding and thus SLP set up oral suction for her to use if needed and demonstrated its use.  Pt's continued lethargy will continue to hinder adequacy of po intake.  No SLP follow up indicated as pt on dysphagia diet for maximial airway protection,comfort with po and all education completed.    HPI HPI: 87 yo male adm to [facility] with AMS, weakness, vomiting episode- dehydration.  Pt found to have AKI.  PMH + for dementia and pt resides at [facility], depression, DDD, HTN.  CT head 4/19 showed chronic atrophic and ischemic changes, CXR 4/19 hypoinflation.  Marland Kitchen      SLP Plan  All goals met       Recommendations  Diet recommendations: Dysphagia 1 (puree);Nectar-thick liquid (tsps thin and ice chips ok) Liquids provided via: Teaspoon;Cup;Straw Medication Administration: Crushed with puree (try with ice cream) Supervision: Full supervision/cueing for compensatory strategies;Trained caregiver to feed  patient Compensations: Slow rate;Small sips/bites (pt to help self feed as able, oral suction if he does not elicit swallow)                Oral Care Recommendations: Oral care BID SLP Visit Diagnosis: Dysphagia, oropharyngeal phase (R13.12) Plan: All goals met       GO                [name] 11/24/2020, 6:50 PM   Kathleen Lime, MS [facility] SLP Acute Rehab Services Office [phone] Pager [phone]

## 2020-11-24 NOTE — Progress Notes (Signed)
PROGRESS NOTE    Troy BALINGIT Sr.  DTH:[id number] DOB: 07-16-1925 DOA: 11/19/2020 PCP: Sunday Spillers, NP   Brief Narrative:  Patient is a 87 year old male with past medical history of advanced Alzheimer's dementia in memory care unit, depression, hypertension presented to the hospital brought in by her family with worsening mental status.  At baseline, patient is mostly wheelchair-bound.  In the ED, patient was noted to be severely dehydrated with acute kidney injury and was oliguric.  There was also concern for possible aspiration.  Patient was then admitted to hospital for further evaluation and treatment.   Assessment & Plan:   Principal Problem:   AKI (acute kidney injury) (HCC) Active Problems:   Dementia (HCC)   Essential hypertension   Hyperkalemia   Leucocytosis   Dehydration   Diarrhea   Hypokalemia  Acute kidney injury Presented with creatinine of 3.8.  Current creatinine of 1.3.  Patient was volume depleted on presentation since he was on diuretics and ACE inhibitor's.  Off Foley catheter.  Renal ultrasound with renal cyst.  Avoid nephrotoxins.  Off IV fluids.  Encourage oral intake.  Plan to discontinue HCTZ on discharge.  Volume depletion. Initially received IV fluid hydration.  Improved.  Encourage oral hydration.  Hyperkalemia Initially received Lokelma.  Subsequently was hypokalemiac.  Received potassium supplements and the potassium is 3.8 today.  Anemia of chronic disease.    Latest hemoglobin of 10.6 and is stable.  Transfuse for hemoglobin less than 7.  Severe Alzheimer's dementia Celexa has been decreased to 5 mg as per family who want it to be weaned off.  Seroquel decreased to 12.5 mg at nighttime.  Patient has issues with dysphagia secondary to dementia.  Speech therapy on board.  Palliative care has been consulted for goals of care as per the family wishes.    Essential hypertension Patient was volume depleted on presentation, has been taken off  hydrochlorothiazide.  Added amlodipine.  Add as needed hydralazine.  Blood pressure is normal.  Leukocytosis Patient is afebrile likely reactive leukocytosis.  Has improved at this time.  WBC 10.0.  Hypokalemia/hypomagnesemia Received magnesium sulfate and potassium.  Improved at this time.  Latest magnesium of 1.7.  Hypophosphatemia.  Replenished through IV.  Phosphorus 3.4 today.  Diarrhea No further episodes reported.  Constipated at this time.  Will resume on MiraLAX.  Sneezing, congestion.  Spoke about possible aspiration risk with the patient's daughter.  Add nasal spray.   Dysphagia likely secondary to dementia. On full liquids with nectar thick consistency diet.  Speech therapy on board.  High risk of aspiration due to advanced dementia.  Acute urinary retention.  Likely secondary to BPH.  Patient was on a Foley catheter for few days and voiding trial was obtained but he failed following trial.  We will add laxatives.  Added finasteride.  He resumed tamsulosin.  Patient might do need to continue Foley catheter for few more days before voiding trial.   DVT prophylaxis:  SCDs  Code Status:  DNR  Family Communication:  I again spoke with the patient's daughter and updated her about the clinical condition of the patient.  Disposition:   Status is: Inpatient  Dispo: The patient is from: Memory care unit.              Anticipated d/c is to: Back to memory care unit, has a Foley catheter at this time.              Patient currently not  stable for discharge.   Difficult to place patient: no   Consultants:   None  Procedures:   CT head 11/19/2020  Chest x-ray 11/19/2020  Antimicrobials:   None   Subjective: Today, patient was seen and examined at bedside.  Patient had urinary retention yesterday and required Cody Foley catheter placement.  He did have elevated blood pressure as well.  Has not had a bowel movement recently.  Patient is a poor  historian.  Objective: Vitals:   11/23/20 1602 11/23/20 1743 11/23/20 2134 11/24/20 0515  BP: 139/85 120/65 118/70 118/83  Pulse: 71 72 60 66  Resp: 14 14 18 17   Temp: 97.9 F (36.6 C) 99.1 F (37.3 C) 98.6 F (37 C) 97.9 F (36.6 C)  TempSrc: Oral     SpO2: 99%  100% 94%  Weight:      Height:        Intake/Output Summary (Last 24 hours) at 11/24/2020 1021 Last data filed at 11/24/2020 0900 Gross per 24 hour  Intake 217.6 ml  Output 1125 ml  Net -907.4 ml   Filed Weights   11/19/20 1846  Weight: 74.6 kg   Physical examination: General:  Average built, not in obvious distress HENT:   No scleral pallor or icterus noted. Oral mucosa is moist.  Chest:   Diminished breath sounds bilaterally.  Coarse breath sounds noted. CVS: S1 &S2 heard. No murmur.  Regular rate and rhythm. Abdomen: Soft, nontender, nondistended.  Bowel sounds are heard.   Extremities: No cyanosis, clubbing or edema.  Peripheral pulses are palpable.  Foley catheter in place. Psych: Alert, awake but disoriented CNS: Moves all extremities. Skin: Warm and dry.  No rashes noted.   Data Reviewed: I have personally reviewed following labs and imaging studies  CBC: Recent Labs  Lab 11/19/20 1857 11/20/20 0503 11/21/20 0550 11/22/20 0517 11/23/20 0518 11/24/20 0517  WBC 19.4* 14.3* 7.5 5.7 10.8* 10.0  NEUTROABS 16.9* 11.8* 5.6  --   --   --   HGB 12.7* 10.8* 10.3* 10.8* 12.6* 10.6*  HCT 38.7* 33.1* 33.1* 33.9* 39.9 33.9*  MCV 95.3 95.7 99.4 98.3 98.0 99.1  PLT 160 143* 143* 159 186 167    Basic Metabolic Panel: Recent Labs  Lab 11/20/20 0437 11/21/20 0550 11/22/20 0517 11/23/20 0518 11/24/20 0517  NA 138 140 143 144 144  K 4.1 3.0* 3.3* 3.6 3.8  CL 104 108 112* 112* 110  CO2 25 25 27 24 27   GLUCOSE 120* 89 97 112* 95  BUN 59* 40* 26* 21 25*  CREATININE 3.39* 1.60* 1.20 1.20 1.38*  CALCIUM 8.3* 8.1* 8.3* 8.9 8.7*  MG  --  1.5* 2.1 1.7 1.7  PHOS  --   --  2.2* 2.0* 3.4     GFR: Estimated Creatinine Clearance: 30.2 mL/min (A) (by C-G formula based on SCr of 1.38 mg/dL (H)).  Liver Function Tests: Recent Labs  Lab 11/19/20 1857 11/20/20 0437 11/21/20 0550 11/22/20 0517  AST 26 20 18   --   ALT 7 11 14   --   ALKPHOS 45 37* 32*  --   BILITOT 1.9* 1.3* 0.9  --   PROT 6.4* 5.5* 5.1*  --   ALBUMIN 3.5 2.8* 2.7* 2.6*    CBG: Recent Labs  Lab 11/19/20 1943  GLUCAP 129*     Recent Results (from the past 240 hour(s))  Resp Panel by RT-PCR (Flu A&B, Covid) Nasopharyngeal Swab     Status: None   Collection Time: 11/19/20  8:22 PM   Specimen: Nasopharyngeal Swab; Nasopharyngeal(NP) swabs in vial transport medium  Result Value Ref Range Status   SARS Coronavirus 2 by RT PCR NEGATIVE NEGATIVE Final    Comment: (NOTE) SARS-CoV-2 target nucleic acids are NOT DETECTED.  The SARS-CoV-2 RNA is generally detectable in upper respiratory specimens during the acute phase of infection. The lowest concentration of SARS-CoV-2 viral copies this assay can detect is 138 copies/mL. A negative result does not preclude SARS-Cov-2 infection and should not be used as the sole basis for treatment or other patient management decisions. A negative result may occur with  improper specimen collection/handling, submission of specimen other than nasopharyngeal swab, presence of viral mutation(s) within the areas targeted by this assay, and inadequate number of viral copies(<138 copies/mL). A negative result must be combined with clinical observations, patient history, and epidemiological information. The expected result is Negative.  Fact Sheet for Patients:  BloggerCourse.com  Fact Sheet for Healthcare Providers:  SeriousBroker.it  This test is no t yet approved or cleared by the [place] FDA and  has been authorized for detection and/or diagnosis of SARS-CoV-2 by FDA under an Emergency Use Authorization (EUA).  This EUA will remain  in effect (meaning this test can be used) for the duration of the COVID-19 declaration under Section 564(b)(1) of the Act, 21 U.S.C.section 360bbb-3(b)(1), unless the authorization is terminated  or revoked sooner.       Influenza A by PCR NEGATIVE NEGATIVE Final   Influenza B by PCR NEGATIVE NEGATIVE Final    Comment: (NOTE) The Xpert Xpress SARS-CoV-2/FLU/RSV plus assay is intended as an aid in the diagnosis of influenza from Nasopharyngeal swab specimens and should not be used as a sole basis for treatment. Nasal washings and aspirates are unacceptable for Xpert Xpress SARS-CoV-2/FLU/RSV testing.  Fact Sheet for Patients: BloggerCourse.com  Fact Sheet for Healthcare Providers: SeriousBroker.it  This test is not yet approved or cleared by the [place] FDA and has been authorized for detection and/or diagnosis of SARS-CoV-2 by FDA under an Emergency Use Authorization (EUA). This EUA will remain in effect (meaning this test can be used) for the duration of the COVID-19 declaration under Section 564(b)(1) of the Act, 21 U.S.C. section 360bbb-3(b)(1), unless the authorization is terminated or revoked.  Performed at [facility], [address], [place], [place] [address]      Radiology Studies: No results found.   Scheduled Meds: . amLODipine  5 mg Oral Daily  . carvedilol  6.25 mg Oral BID WC  . Chlorhexidine Gluconate Cloth  6 each Topical Daily  . citalopram  5 mg Oral QPC supper  . cyanocobalamin  1,000 mcg Subcutaneous Daily  . feeding supplement  237 mL Oral TID WC  . finasteride  5 mg Oral Daily  . ipratropium  2 spray Each Nare Q12H  . multivitamin with minerals  1 tablet Oral QPC supper  . QUEtiapine  12.5 mg Oral QHS  . saccharomyces boulardii  250 mg Oral BID AC  . tamsulosin  0.4 mg Oral QHS   Continuous Infusions:    LOS: 5 days    Joycelyn Das,  MD [facility] 11/24/2020, 10:21 AM

## 2020-11-25 DIAGNOSIS — R4182 Altered mental status, unspecified: Secondary | ICD-10-CM

## 2020-11-25 DIAGNOSIS — Z515 Encounter for palliative care: Secondary | ICD-10-CM

## 2020-11-25 DIAGNOSIS — Z7189 Other specified counseling: Secondary | ICD-10-CM

## 2020-11-25 LAB — CBC
HCT: 35.8 % — ABNORMAL LOW (ref 39.0–52.0)
Hemoglobin: 11.2 g/dL — ABNORMAL LOW (ref 13.0–17.0)
MCH: 30.9 pg (ref 26.0–34.0)
MCHC: 31.3 g/dL (ref 30.0–36.0)
MCV: 98.6 fL (ref 80.0–100.0)
Platelets: 186 10*3/uL (ref 150–400)
RBC: 3.63 MIL/uL — ABNORMAL LOW (ref 4.22–5.81)
RDW: 13.7 % (ref 11.5–15.5)
WBC: 8.2 10*3/uL (ref 4.0–10.5)
nRBC: 0 % (ref 0.0–0.2)

## 2020-11-25 LAB — BASIC METABOLIC PANEL
Anion gap: 9 (ref 5–15)
BUN: 20 mg/dL (ref 8–23)
CO2: 24 mmol/L (ref 22–32)
Calcium: 8.7 mg/dL — ABNORMAL LOW (ref 8.9–10.3)
Chloride: 109 mmol/L (ref 98–111)
Creatinine, Ser: 1.11 mg/dL (ref 0.61–1.24)
GFR, Estimated: >60 mL/min (ref >60)
Glucose, Bld: 90 mg/dL (ref 70–99)
Potassium: 3.8 mmol/L (ref 3.5–5.1)
Sodium: 142 mmol/L (ref 135–145)

## 2020-11-25 LAB — MAGNESIUM: Magnesium: 1.5 mg/dL — ABNORMAL LOW (ref 1.7–2.4)

## 2020-11-25 LAB — PHOSPHORUS: Phosphorus: 2.7 mg/dL (ref 2.5–4.6)

## 2020-11-25 NOTE — Progress Notes (Signed)
PROGRESS NOTE    [name] Sr.  MKL:[id number] DOB: [birth year] DOA: 11/19/2020 PCP: Sunday Spillers, NP   Brief Narrative:  Patient is a 87 year old male with past medical history of advanced Alzheimer's dementia in memory care unit, depression, hypertension presented to the hospital brought in by her family with worsening mental status.  At baseline, patient is mostly wheelchair-bound.  In the ED, patient was noted to be severely dehydrated with acute kidney injury and was oliguric.  There was also concern for possible aspiration.  Patient was then admitted to hospital for further evaluation and treatment.   Assessment & Plan:   Principal Problem:   AKI (acute kidney injury) (HCC) Active Problems:   Dementia (HCC)   Essential hypertension   Hyperkalemia   Leucocytosis   Dehydration   Diarrhea   Hypokalemia  Acute kidney injury Presented with creatinine of 3.8.  Current creatinine of 1.1.  Patient was volume depleted on presentation since he was on diuretics and ACE inhibitor's.  Off Foley catheter.  Renal ultrasound with renal cyst.  Avoid nephrotoxins.  Off IV fluids.  Encourage oral intake.  Plan to discontinue HCTZ on discharge.  Volume depletion. Initially received IV fluid hydration.  Improved.  Encourage oral hydration.  Hyperkalemia Initially received Lokelma.  Subsequently was hypokalemiac.  Received potassium supplements and the potassium is 3.8.  Anemia of chronic disease.    Latest hemoglobin of 11.2 and is stable.  Transfuse for hemoglobin less than 7.  Severe Alzheimer's dementia Celexa has been decreased to 5 mg as per family who want it to be weaned off.  Seroquel decreased to 12.5 mg at nighttime.  Patient has issues with dysphagia secondary to dementia.  Speech therapy on board.  Palliative care has been consulted for goals of care as per the family wishes.    Essential hypertension Patient was volume depleted on presentation, has been taken off  hydrochlorothiazide. On amlodipine.  Add as needed hydralazine.  Blood pressure is normal.  Leukocytosis Patient is afebrile likely reactive leukocytosis.  Has improved at this time.  Latest WBC of 8.2  Hypokalemia Improved after replacement  hypomagnesemia We will continue to replenish.  Hypophosphatemia.  Replenished through IV.  Phosphorus of 2.7.  Sneezing, congestion.   Add nasal spray.  Aspiration precautions   Dysphagia likely secondary to dementia. On full liquids with nectar thick consistency diet.  Speech therapy on board.  High risk of aspiration due to advanced dementia.  Acute urinary retention.  Likely secondary to BPH.  Patient was on a Foley catheter for few days and voiding trial was obtained but he failed following trial.  Added finasteride.  Continue tamsulosin.  Patient might do need to continue Foley catheter for few more days before voiding trial.  Goals of care. Palliative care was consulted and the plan is to return to skilled nursing facility with hospice.  DVT prophylaxis:  SCDs  Code Status:  DNR  Family Communication:  None today.  I spoke with the patient's daughter yesterday.    Disposition:   Status is: Inpatient  Dispo: The patient is from: Memory care unit.              Anticipated d/c is to: Back to memory care unit with hospice, has a Foley catheter at this time.              Patient currently not stable for discharge.   Difficult to place patient: no   Consultants:   None  Procedures:   CT head 11/19/2020  Chest x-ray 11/19/2020  Antimicrobials:   None   Subjective: Today, patient was seen and examined at bedside.  Disoriented and confused.  Getting breathing treatment.     Objective: Vitals:   11/25/20 0509 11/25/20 0840 11/25/20 0843 11/25/20 0846  BP: (!) 157/94 (!) 175/109    Pulse: 70 (!) 47    Resp:      Temp:      TempSrc:      SpO2:   95% 95%  Weight:      Height:        Intake/Output Summary (Last 24  hours) at 11/25/2020 1021 Last data filed at 11/25/2020 0900 Gross per 24 hour  Intake 200 ml  Output 700 ml  Net -500 ml   Filed Weights   11/19/20 1846  Weight: 74.6 kg   Physical examination: General:  Average built, not in obvious distress ill and deconditioned, disoriented HENT:   No scleral pallor or icterus noted. Oral mucosa is moist.  Chest: Coarse breath sounds noted bilaterally.  Diminished breath sounds bilaterally.   CVS: S1 &S2 heard. No murmur.  Regular rate and rhythm. Abdomen: Soft, nontender, nondistended.  Bowel sounds are heard.  Foley catheter in place. Extremities: No cyanosis, clubbing or edema.  Peripheral pulses are palpable. Psych: Alert, awake and disoriented, CNS: Moves all extremities. Skin: Warm and dry.  No rashes noted.   Data Reviewed: I have personally reviewed following labs and imaging studies  CBC: Recent Labs  Lab 11/19/20 1857 11/20/20 0503 11/21/20 0550 11/22/20 0517 11/23/20 0518 11/24/20 0517 11/25/20 0747  WBC 19.4* 14.3* 7.5 5.7 10.8* 10.0 8.2  NEUTROABS 16.9* 11.8* 5.6  --   --   --   --   HGB 12.7* 10.8* 10.3* 10.8* 12.6* 10.6* 11.2*  HCT 38.7* 33.1* 33.1* 33.9* 39.9 33.9* 35.8*  MCV 95.3 95.7 99.4 98.3 98.0 99.1 98.6  PLT 160 143* 143* 159 186 167 186    Basic Metabolic Panel: Recent Labs  Lab 11/21/20 0550 11/22/20 0517 11/23/20 0518 11/24/20 0517 11/25/20 0747  NA 140 143 144 144 142  K 3.0* 3.3* 3.6 3.8 3.8  CL 108 112* 112* 110 109  CO2 25 27 24 27 24   GLUCOSE 89 97 112* 95 90  BUN 40* 26* 21 25* 20  CREATININE 1.60* 1.20 1.20 1.38* 1.11  CALCIUM 8.1* 8.3* 8.9 8.7* 8.7*  MG 1.5* 2.1 1.7 1.7 1.5*  PHOS  --  2.2* 2.0* 3.4 2.7    GFR: Estimated Creatinine Clearance: 37.5 mL/min (by C-G formula based on SCr of 1.11 mg/dL).  Liver Function Tests: Recent Labs  Lab 11/19/20 1857 11/20/20 0437 11/21/20 0550 11/22/20 0517  AST 26 20 18   --   ALT 7 11 14   --   ALKPHOS 45 37* 32*  --   BILITOT 1.9* 1.3*  0.9  --   PROT 6.4* 5.5* 5.1*  --   ALBUMIN 3.5 2.8* 2.7* 2.6*    CBG: Recent Labs  Lab 11/19/20 1943  GLUCAP 129*     Recent Results (from the past 240 hour(s))  Resp Panel by RT-PCR (Flu A&B, Covid) Nasopharyngeal Swab     Status: None   Collection Time: 11/19/20  8:22 PM   Specimen: Nasopharyngeal Swab; Nasopharyngeal(NP) swabs in vial transport medium  Result Value Ref Range Status   SARS Coronavirus 2 by RT PCR NEGATIVE NEGATIVE Final    Comment: (NOTE) SARS-CoV-2 target nucleic acids are NOT DETECTED.  The SARS-CoV-2 RNA is generally detectable in upper respiratory specimens during the acute phase of infection. The lowest concentration of SARS-CoV-2 viral copies this assay can detect is 138 copies/mL. A negative result does not preclude SARS-Cov-2 infection and should not be used as the sole basis for treatment or other patient management decisions. A negative result may occur with  improper specimen collection/handling, submission of specimen other than nasopharyngeal swab, presence of viral mutation(s) within the areas targeted by this assay, and inadequate number of viral copies(<138 copies/mL). A negative result must be combined with clinical observations, patient history, and epidemiological information. The expected result is Negative.  Fact Sheet for Patients:  BloggerCourse.com  Fact Sheet for Healthcare Providers:  SeriousBroker.it  This test is no t yet approved or cleared by the [place] FDA and  has been authorized for detection and/or diagnosis of SARS-CoV-2 by FDA under an Emergency Use Authorization (EUA). This EUA will remain  in effect (meaning this test can be used) for the duration of the COVID-19 declaration under Section 564(b)(1) of the Act, 21 U.S.C.section 360bbb-3(b)(1), unless the authorization is terminated  or revoked sooner.       Influenza A by PCR NEGATIVE NEGATIVE Final    Influenza B by PCR NEGATIVE NEGATIVE Final    Comment: (NOTE) The Xpert Xpress SARS-CoV-2/FLU/RSV plus assay is intended as an aid in the diagnosis of influenza from Nasopharyngeal swab specimens and should not be used as a sole basis for treatment. Nasal washings and aspirates are unacceptable for Xpert Xpress SARS-CoV-2/FLU/RSV testing.  Fact Sheet for Patients: BloggerCourse.com  Fact Sheet for Healthcare Providers: SeriousBroker.it  This test is not yet approved or cleared by the [place] FDA and has been authorized for detection and/or diagnosis of SARS-CoV-2 by FDA under an Emergency Use Authorization (EUA). This EUA will remain in effect (meaning this test can be used) for the duration of the COVID-19 declaration under Section 564(b)(1) of the Act, 21 U.S.C. section 360bbb-3(b)(1), unless the authorization is terminated or revoked.  Performed at [facility], [address], [place], [place] [address]      Radiology Studies: No results found.   Scheduled Meds: . amLODipine  5 mg Oral Daily  . bisacodyl  10 mg Oral Daily  . carvedilol  6.25 mg Oral BID WC  . Chlorhexidine Gluconate Cloth  6 each Topical Daily  . citalopram  5 mg Oral QPC supper  . cyanocobalamin  1,000 mcg Subcutaneous Daily  . feeding supplement  237 mL Oral TID WC  . finasteride  5 mg Oral Daily  . fluticasone  1 spray Each Nare Daily  . ipratropium  2 spray Each Nare Q12H  . multivitamin with minerals  1 tablet Oral QPC supper  . polyethylene glycol  17 g Oral Daily  . QUEtiapine  12.5 mg Oral QHS  . saccharomyces boulardii  250 mg Oral BID AC  . tamsulosin  0.4 mg Oral QHS   Continuous Infusions:    LOS: 6 days    Joycelyn Das, MD [facility] 11/25/2020, 10:21 AM

## 2020-11-25 NOTE — Progress Notes (Signed)
Palliative Care Progress Note  Reason for visit: Goals of care  I checked in briefly on [name] today.  He was sleeping in no distress.  He has dementia and his daughter is his surrogate.  I called and spoke with patient's daughter, [name].  We had originally planned to meet in person, but I was unable to make scheduled time and called to reschedule.  During conversation, [name] let me know that family has been discussing and would now like to proceed with full hospice services at [facility] at time of discharge.  This is certainly appropriate care plan for [name] and lines up well with stated goals.  I offered to still meet today, but, as family knows that they want hospice, I let [name] know it would probably make more sense to place referral to hospice to answer any further questions family has.  She was agreeable and has contact number if I can be of further assistance.    - DNR/DNI - Plan for back to [facility] with hospice services through [employer] [facility].  TOC referral placed and appreciate assistance. - Discussed MOST form.  As plan is to go back to [facility] with hospice, it is not essential to complete this prior to discharge.  If hospice requests or family would like to complete, I would be happy to do so.  However, please do not delay discharge for purposes of completion of MOST. - [name] is going to bring copy of HCPOA paperwork to have scanned into [name] chart.  This is not essential to discharge planning, but will be good to have this available in Epic if needed at any point in the future.  If [name] is able to bring it, please make copy of HCPOA paperwork and place it in shadow chart.  Total time: 25 minutes Greater than 50%  of this time was spent counseling and coordinating care related to the above assessment and plan.  Romie Minus, MD [facility] Palliative Medicine Team [phone]

## 2020-11-25 NOTE — Progress Notes (Signed)
Civil engineer, contracting [facility])  Received request from [facility] for hospice services at [facility] after discharge.  Chart and pt information under review by [facility] physician.  Hospice eligibility pending at this time.  Hospital liaison called and left a voicemail for pt's daughter Rayfield Citizen to initiate education related to hospice philosophy. [facility] information and contact numbers given left on voicemail.  [facility] liaison team will follow up tomorrow.  Thank you for the opportunity to participate in this pt's care.  Gillian Scarce, BSN, RN [employer] [phone] (24h on call)

## 2020-11-25 NOTE — TOC Progression Note (Addendum)
Transition of Care (TOC) - Progression Note    Patient Details  Name: Troy SCHRIEBER Sr. MRN: [id number] Date of Birth: January 28, 1925  Transition of Care [facility]) CM/SW Contact  Darleene Cleaver, [place] Phone Number: 11/25/2020, 2:46 PM  Clinical Narrative:     CSW called [facility] to discuss patient returning back on Monday with Hospice Services through [employer].  Patient is already receiving palliative services.  CSW spoke to [employer] regarding patient and family wanting to return back to [facility] with hospice services.  Per [facility], a Production designer, theatre/television/film is not back until Monday.  [facility] staff said to have CSW call Margorie John on Monday at [phone].    Expected Discharge Plan: Assisted Living Barriers to Discharge: Other (comment) (No weekend admissions/ patient has new Foley)  Expected Discharge Plan and Services Expected Discharge Plan: Assisted Living   Discharge Planning Services: CM Consult   Living arrangements for the past 2 months: Assisted Living Facility                                       Social Determinants of Health (SDOH) Interventions    Readmission Risk Interventions No flowsheet data found.

## 2020-11-26 MED ORDER — RAMIPRIL 5 MG PO CAPS
5.0000 mg | ORAL_CAPSULE | Freq: Every day | ORAL | Status: DC
  Administered 2020-11-26 – 2020-11-27 (×2): 5 mg via ORAL
  Filled 2020-11-26 (×2): qty 1

## 2020-11-26 MED ORDER — MAGNESIUM OXIDE 400 (241.3 MG) MG PO TABS
400.0000 mg | ORAL_TABLET | Freq: Two times a day (BID) | ORAL | Status: DC
  Administered 2020-11-26 – 2020-11-27 (×3): 400 mg via ORAL
  Filled 2020-11-26 (×3): qty 1

## 2020-11-26 NOTE — Progress Notes (Addendum)
Civil engineer, contracting [facility]) Hospital Liaison: RN note    Notified by Transition of Care Manger of patient/family request for [facility] services at [facility] after discharge. Chart and patient information under reviewed by [facility] physician. Hospice eligibility confirmed.    Writer spoke with daughter, Rayfield Citizen to initiate education related to hospice philosophy, services and team approach to care. Rayfield Citizen verbalized understanding of information given.  Please send signed and completed DNR form home with patient/family. Patient will need prescriptions for discharge comfort medications. Daughter is requesting foley catheter be left in due to patient having urinary retention. [facility] at [facility].   DME needs have been discussed, patient currently has the following equipment in the: W/C.  Patient/family requests the following DME for delivery to the home:  Hospital bed. [facility] liaison will follow up with [facility] and order bed on Monday.     [facility] aware of the above. Please notify [facility] when patient is ready to leave the unit at discharge. (Call [phone] or [phone] after 5pm.) [facility] information and contact numbers given to daughter.    A Please do not hesitate to call with questions.    Thank you,   Elsie Saas, RN, [facility]      [facility] Liaison (listed on [facility] under Hospice [facility])    [phone]

## 2020-11-26 NOTE — Progress Notes (Signed)
PROGRESS NOTE    [name] Sr.  JIR:[id number] DOB: [birth year] DOA: 11/19/2020 PCP: Sunday Spillers, NP   Brief Narrative:  Patient is a 87 year old male with past medical history of advanced Alzheimer's dementia in memory care unit, depression, hypertension presented to the hospital brought in by her family with worsening mental status.  At baseline, patient is mostly wheelchair-bound.  In the ED, patient was noted to be severely dehydrated with acute kidney injury and was oliguric.  There was also concern for possible aspiration.  Patient was then admitted to hospital for further evaluation and treatment.   Assessment & Plan:   Principal Problem:   AKI (acute kidney injury) (HCC) Active Problems:   Dementia (HCC)   Essential hypertension   Hyperkalemia   Leucocytosis   Dehydration   Diarrhea   Hypokalemia  Acute kidney injury Presented with creatinine of 3.8.  Current creatinine of 1.1.  Thought to be secondary to volume depletion, has resolved at this time.  Renal ultrasound with cysts.  Plan to discontinue hydrochlorothiazide on discharge.  Volume depletion. Initially received IV fluid hydration.  Improved.  Encourage oral hydration.  Hyperkalemia Resolved.  Potassium 3.8 at this time  Anemia of chronic disease.    Latest hemoglobin of 11.2 and is stable.    Severe Alzheimer's dementia Celexa has been decreased to 5 mg as per family who want it to be weaned off.  Seroquel decreased to 12.5 mg at nighttime.  Patient has issues with dysphagia secondary to dementia.  Speech therapy on board.  Palliative care on board and recommended skilled nursing facility with hospice.  Essential hypertension Patient was volume depleted on presentation, has been taken off hydrochlorothiazide. On amlodipine.  Add as needed hydralazine.  Blood pressure is on the higher side.  Will resume ramipril.  Leukocytosis Patient is afebrile likely reactive leukocytosis.  Has improved at this  time.  Latest WBC of 8.2  Hypokalemia Improved after replacement  hypomagnesemia We will continue to replenish.  Check levels in a.m.  Hypophosphatemia.  Replenished through IV.  Phosphorus of 2.7.  Sneezing, congestion.   Added nasal spray.  Continue aspiration precautions   Dysphagia likely secondary to dementia. On full liquids with nectar thick consistency diet.  Speech therapy on board.  High risk of aspiration due to advanced dementia.  Acute urinary retention.  Likely secondary to BPH.  Patient was on a Foley catheter for few days and voiding trial was obtained but he failed following trial.  Added finasteride.  Continue tamsulosin.  Patient might do need to continue Foley catheter for few more days before voiding trial will likely need to go to skilled nursing facility with hospice care for  Goals of care. Palliative care recommended return to skilled nursing facility with hospice.  DVT prophylaxis:  SCDs  Code Status:  DNR  Family Communication:  None today.   Disposition:   Status is: Inpatient  Dispo: The patient is from: Memory care unit.              Anticipated d/c is to: Back to memory care unit with hospice, has a Foley catheter at this time.              Patient currently not stable for discharge.   Difficult to place patient: no   Consultants:   None  Procedures:   CT head 11/19/2020  Chest x-ray 11/19/2020  Antimicrobials:   None   Subjective: Today, patient was seen and examined at bedside.  Incomprehensible speech.  Denies pain but poor historian.   Objective: Vitals:   11/25/20 2124 11/26/20 0616 11/26/20 0807 11/26/20 0941  BP: (!) 169/87 (!) 150/94 (!) 165/90 (!) 157/104  Pulse: 62 64 72   Resp: 20 (!) 21    Temp: 98.9 F (37.2 C)     TempSrc:      SpO2: 96% 94%    Weight:      Height:        Intake/Output Summary (Last 24 hours) at 11/26/2020 1350 Last data filed at 11/26/2020 1227 Gross per 24 hour  Intake 50 ml  Output  1700 ml  Net -1650 ml   Filed Weights   11/19/20 1846  Weight: 74.6 kg   Physical examination: General:  Average built, not in obvious distress, ill and deconditioned, disoriented HENT:   No scleral pallor or icterus noted. Oral mucosa is moist.  Chest: Coarse breath sounds noted bilaterally, diminished breath sounds bilaterally CVS: S1 &S2 heard. No murmur.  Regular rate and rhythm. Abdomen: Soft, nontender, nondistended.  Bowel sounds are heard.  Foley catheter in place. Extremities: No cyanosis, clubbing or edema.  Peripheral pulses are palpable. Psych: Alert, awake and disoriented, incomprehensible speech CNS:  No cranial nerve deficits.  Moves all extremities. Skin: Warm and dry.  No rashes noted.   Data Reviewed: I have personally reviewed following labs and imaging studies  CBC: Recent Labs  Lab 11/19/20 1857 11/20/20 0503 11/21/20 0550 11/22/20 0517 11/23/20 0518 11/24/20 0517 11/25/20 0747  WBC 19.4* 14.3* 7.5 5.7 10.8* 10.0 8.2  NEUTROABS 16.9* 11.8* 5.6  --   --   --   --   HGB 12.7* 10.8* 10.3* 10.8* 12.6* 10.6* 11.2*  HCT 38.7* 33.1* 33.1* 33.9* 39.9 33.9* 35.8*  MCV 95.3 95.7 99.4 98.3 98.0 99.1 98.6  PLT 160 143* 143* 159 186 167 186    Basic Metabolic Panel: Recent Labs  Lab 11/21/20 0550 11/22/20 0517 11/23/20 0518 11/24/20 0517 11/25/20 0747  NA 140 143 144 144 142  K 3.0* 3.3* 3.6 3.8 3.8  CL 108 112* 112* 110 109  CO2 25 27 24 27 24   GLUCOSE 89 97 112* 95 90  BUN 40* 26* 21 25* 20  CREATININE 1.60* 1.20 1.20 1.38* 1.11  CALCIUM 8.1* 8.3* 8.9 8.7* 8.7*  MG 1.5* 2.1 1.7 1.7 1.5*  PHOS  --  2.2* 2.0* 3.4 2.7    GFR: Estimated Creatinine Clearance: 37.5 mL/min (by C-G formula based on SCr of 1.11 mg/dL).  Liver Function Tests: Recent Labs  Lab 11/19/20 1857 11/20/20 0437 11/21/20 0550 11/22/20 0517  AST 26 20 18   --   ALT 7 11 14   --   ALKPHOS 45 37* 32*  --   BILITOT 1.9* 1.3* 0.9  --   PROT 6.4* 5.5* 5.1*  --   ALBUMIN 3.5  2.8* 2.7* 2.6*    CBG: Recent Labs  Lab 11/19/20 1943  GLUCAP 129*     Recent Results (from the past 240 hour(s))  Resp Panel by RT-PCR (Flu A&B, Covid) Nasopharyngeal Swab     Status: None   Collection Time: 11/19/20  8:22 PM   Specimen: Nasopharyngeal Swab; Nasopharyngeal(NP) swabs in vial transport medium  Result Value Ref Range Status   SARS Coronavirus 2 by RT PCR NEGATIVE NEGATIVE Final    Comment: (NOTE) SARS-CoV-2 target nucleic acids are NOT DETECTED.  The SARS-CoV-2 RNA is generally detectable in upper respiratory specimens during the acute phase of infection. The lowest  concentration of SARS-CoV-2 viral copies this assay can detect is 138 copies/mL. A negative result does not preclude SARS-Cov-2 infection and should not be used as the sole basis for treatment or other patient management decisions. A negative result may occur with  improper specimen collection/handling, submission of specimen other than nasopharyngeal swab, presence of viral mutation(s) within the areas targeted by this assay, and inadequate number of viral copies(<138 copies/mL). A negative result must be combined with clinical observations, patient history, and epidemiological information. The expected result is Negative.  Fact Sheet for Patients:  BloggerCourse.com  Fact Sheet for Healthcare Providers:  SeriousBroker.it  This test is no t yet approved or cleared by the [place] FDA and  has been authorized for detection and/or diagnosis of SARS-CoV-2 by FDA under an Emergency Use Authorization (EUA). This EUA will remain  in effect (meaning this test can be used) for the duration of the COVID-19 declaration under Section 564(b)(1) of the Act, 21 U.S.C.section 360bbb-3(b)(1), unless the authorization is terminated  or revoked sooner.       Influenza A by PCR NEGATIVE NEGATIVE Final   Influenza B by PCR NEGATIVE NEGATIVE Final     Comment: (NOTE) The Xpert Xpress SARS-CoV-2/FLU/RSV plus assay is intended as an aid in the diagnosis of influenza from Nasopharyngeal swab specimens and should not be used as a sole basis for treatment. Nasal washings and aspirates are unacceptable for Xpert Xpress SARS-CoV-2/FLU/RSV testing.  Fact Sheet for Patients: BloggerCourse.com  Fact Sheet for Healthcare Providers: SeriousBroker.it  This test is not yet approved or cleared by the [place] FDA and has been authorized for detection and/or diagnosis of SARS-CoV-2 by FDA under an Emergency Use Authorization (EUA). This EUA will remain in effect (meaning this test can be used) for the duration of the COVID-19 declaration under Section 564(b)(1) of the Act, 21 U.S.C. section 360bbb-3(b)(1), unless the authorization is terminated or revoked.  Performed at [facility], [address], [place], [place] [address]      Radiology Studies: No results found.   Scheduled Meds: . amLODipine  5 mg Oral Daily  . bisacodyl  10 mg Oral Daily  . carvedilol  6.25 mg Oral BID WC  . Chlorhexidine Gluconate Cloth  6 each Topical Daily  . citalopram  5 mg Oral QPC supper  . cyanocobalamin  1,000 mcg Subcutaneous Daily  . feeding supplement  237 mL Oral TID WC  . finasteride  5 mg Oral Daily  . fluticasone  1 spray Each Nare Daily  . ipratropium  2 spray Each Nare Q12H  . multivitamin with minerals  1 tablet Oral QPC supper  . polyethylene glycol  17 g Oral Daily  . QUEtiapine  12.5 mg Oral QHS  . saccharomyces boulardii  250 mg Oral BID AC  . tamsulosin  0.4 mg Oral QHS   Continuous Infusions:    LOS: 7 days    Joycelyn Das, MD [facility] 11/26/2020, 1:50 PM

## 2020-11-27 ENCOUNTER — Encounter (HOSPITAL_COMMUNITY): Payer: Self-pay | Admitting: Internal Medicine

## 2020-11-27 LAB — CBC
HCT: 36.9 % — ABNORMAL LOW (ref 39.0–52.0)
Hemoglobin: 11.7 g/dL — ABNORMAL LOW (ref 13.0–17.0)
MCH: 30.8 pg (ref 26.0–34.0)
MCHC: 31.7 g/dL (ref 30.0–36.0)
MCV: 97.1 fL (ref 80.0–100.0)
Platelets: 204 10*3/uL (ref 150–400)
RBC: 3.8 MIL/uL — ABNORMAL LOW (ref 4.22–5.81)
RDW: 13.6 % (ref 11.5–15.5)
WBC: 8 10*3/uL (ref 4.0–10.5)
nRBC: 0 % (ref 0.0–0.2)

## 2020-11-27 LAB — BASIC METABOLIC PANEL
Anion gap: 8 (ref 5–15)
BUN: 16 mg/dL (ref 8–23)
CO2: 28 mmol/L (ref 22–32)
Calcium: 8.7 mg/dL — ABNORMAL LOW (ref 8.9–10.3)
Chloride: 106 mmol/L (ref 98–111)
Creatinine, Ser: 0.94 mg/dL (ref 0.61–1.24)
GFR, Estimated: >60 mL/min (ref >60)
Glucose, Bld: 105 mg/dL — ABNORMAL HIGH (ref 70–99)
Potassium: 3.1 mmol/L — ABNORMAL LOW (ref 3.5–5.1)
Sodium: 142 mmol/L (ref 135–145)

## 2020-11-27 LAB — RESP PANEL BY RT-PCR (FLU A&B, COVID) ARPGX2
Influenza A by PCR: NEGATIVE
Influenza B by PCR: NEGATIVE
SARS Coronavirus 2 by RT PCR: NEGATIVE

## 2020-11-27 MED ORDER — FINASTERIDE 5 MG PO TABS: 5.0000 mg | ORAL_TABLET | Freq: Every day | ORAL | Status: AC

## 2020-11-27 MED ORDER — AMLODIPINE BESYLATE 5 MG PO TABS: 5.0000 mg | ORAL_TABLET | Freq: Every day | ORAL | Status: AC

## 2020-11-27 MED ORDER — RAMIPRIL 5 MG PO CAPS: 5.0000 mg | ORAL_CAPSULE | Freq: Every day | ORAL | Status: AC

## 2020-11-27 MED ORDER — POTASSIUM CHLORIDE 10 MEQ/100ML IV SOLN
10.0000 meq | INTRAVENOUS | Status: AC
  Administered 2020-11-27 (×2): 10 meq via INTRAVENOUS
  Filled 2020-11-27 (×2): qty 100

## 2020-11-27 MED ORDER — POTASSIUM CHLORIDE CRYS ER 20 MEQ PO TBCR
40.0000 meq | EXTENDED_RELEASE_TABLET | Freq: Once | ORAL | Status: AC
  Administered 2020-11-27: 40 meq via ORAL
  Filled 2020-11-27: qty 2

## 2020-11-27 MED ORDER — POLYETHYLENE GLYCOL 3350 17 G PO PACK: 17.0000 g | PACK | Freq: Every day | ORAL | Status: AC | PRN

## 2020-11-27 MED ORDER — ALBUTEROL SULFATE (2.5 MG/3ML) 0.083% IN NEBU: 2.5000 mg | INHALATION_SOLUTION | Freq: Four times a day (QID) | RESPIRATORY_TRACT | Status: AC | PRN

## 2020-11-27 MED ORDER — FLUTICASONE PROPIONATE 50 MCG/ACT NA SUSP: 1.0000 | Freq: Every day | NASAL | 2 refills | Status: AC

## 2020-11-27 MED ORDER — QUETIAPINE FUMARATE 25 MG PO TABS: 12.5000 mg | ORAL_TABLET | Freq: Every day | ORAL | Status: DC

## 2020-11-27 MED ORDER — GUAIFENESIN-DM 100-10 MG/5ML PO SYRP: 5.0000 mL | ORAL_SOLUTION | ORAL | Status: AC | PRN

## 2020-11-27 MED ORDER — CITALOPRAM HYDROBROMIDE 10 MG PO TABS
5.0000 mg | ORAL_TABLET | Freq: Every day | ORAL | Status: AC
Start: 2020-11-27 — End: ?

## 2020-11-27 MED ORDER — MAGNESIUM OXIDE 400 (241.3 MG) MG PO TABS: 400.0000 mg | ORAL_TABLET | Freq: Two times a day (BID) | ORAL | 0 refills | Status: AC

## 2020-11-27 MED ORDER — QUETIAPINE FUMARATE 25 MG PO TABS: 12.5000 mg | ORAL_TABLET | Freq: Every day | ORAL | Status: AC

## 2020-11-27 NOTE — Progress Notes (Signed)
Report called to [employer] med tech, Janan Halter. [facility] called and will be picking up patient later this evening.

## 2020-11-27 NOTE — Progress Notes (Signed)
Writer attempted x 2 to give report to [facility] with number provided, [phone]. No answer.

## 2020-11-27 NOTE — NC FL2 (Signed)
[place] MEDICAID FL2 LEVEL OF CARE SCREENING TOOL     IDENTIFICATION  Patient Name: Troy LUCZAK Sr. Birthdate: 1925-05-28 Sex: male Admission Date (Current Location): 11/19/2020  [facility] and [place] Number:  Producer, television/film/video and Address:  [facility],  [address] [place] [address]      Provider Number: [id number]  Attending Physician Name and Address:  Joycelyn Das, MD  Relative Name and Phone Number:  Caprice Renshaw (Daughter)   [phone]    Current Level of Care: Hospital Recommended Level of Care: Assisted Living [facility] Care Prior Approval Number:    Date Approved/Denied:   PASRR Number:    Discharge Plan:  ([facility] with hospice support)    Current Diagnoses: Patient Active Problem List   Diagnosis Date Noted  . Diarrhea 11/21/2020  . Hypokalemia 11/21/2020  . AKI (acute kidney injury) (HCC) 11/19/2020  . Dementia (HCC) 11/19/2020  . Essential hypertension 11/19/2020  . Hyperkalemia 11/19/2020  . Leucocytosis 11/19/2020  . Dehydration 11/19/2020    Orientation RESPIRATION BLADDER Height & Weight     Self  Normal Indwelling catheter Weight: 74.6 kg Height:  5\' 6"  (167.6 cm)  BEHAVIORAL SYMPTOMS/MOOD NEUROLOGICAL BOWEL NUTRITION STATUS  Other (Comment) (none)  (none) Incontinent Diet (Soft foods, thin liquid)  AMBULATORY STATUS COMMUNICATION OF NEEDS Skin   Total Care Verbally Normal                       Personal Care Assistance Level of Assistance  Bathing,Feeding,Dressing Bathing Assistance: Maximum assistance Feeding assistance: Maximum assistance Dressing Assistance: Maximum assistance     Functional Limitations Info  Sight,Hearing,Speech Sight Info: Adequate Hearing Info: Impaired Speech Info: Adequate    SPECIAL CARE FACTORS FREQUENCY                       Contractures Contractures Info: Not present    Additional Factors Info  Code Status,Allergies Code  Status Info: DNR Allergies Info: NKA           Current Medications (11/27/2020):      Discharge Medications:   * acetaminophen 500 MG tablet Commonly known as: TYLENOL Take 500 mg by mouth 3 (three) times daily.          * acetaminophen 500 MG tablet Commonly known as: TYLENOL Take 500 mg by mouth every 6 (six) hours as needed (pain).         Icon medications to start taking   albuterol (2.5 MG/3ML) 0.083% nebulizer solution Commonly known as: PROVENTIL Take 3 mLs (2.5 mg total) by nebulization every 6 (six) hours as needed for wheezing or shortness of breath.         Icon medications to start taking   amLODipine 5 MG tablet Commonly known as: NORVASC Start taking on: November 28, 2020 Take 1 tablet (5 mg total) by mouth daily.          carvedilol 6.25 MG tablet Commonly known as: COREG Take 6.25 mg by mouth 2 (two) times daily.         Icon medications to change how you take   citalopram 10 MG tablet Commonly known as: CELEXA Take 0.5 tablets (5 mg total) by mouth daily. What changed: how much to take         Icon medications to start taking   finasteride 5 MG tablet Commonly known as: PROSCAR Start taking on: November 28, 2020 Take 1 tablet (5  mg total) by mouth daily.         Icon medications to start taking   fluticasone 50 MCG/ACT nasal spray Commonly known as: FLONASE Start taking on: November 28, 2020 Place 1 spray into both nostrils daily.         Icon medications to start taking   guaiFENesin-dextromethorphan 100-10 MG/5ML syrup Commonly known as: ROBITUSSIN DM Take 5 mLs by mouth every 4 (four) hours as needed for cough.          ipratropium 0.03 % nasal spray Commonly known as: ATROVENT Place 2 sprays into both nostrils 2 (two) times daily.         Icon medications to start taking   magnesium oxide 400 (241.3 Mg) MG tablet Commonly known as: MAG-OX Take 1 tablet (400 mg total) by mouth 2 (two) times daily.          melatonin 3 MG Tabs tablet Take 3 mg by mouth at  bedtime.         Icon medications to start taking   polyethylene glycol 17 g packet Commonly known as: MIRALAX / GLYCOLAX Take 17 g by mouth daily as needed for moderate constipation.         Icon medications to change how you take   QUEtiapine 25 MG tablet Commonly known as: SEROQUEL Take 0.5 tablets (12.5 mg total) by mouth at bedtime. Take 1/4 tablet (6.25mg ) by mouth every morning, and 1/2 tablet (12.5mg ) by mouth at bedtime What changed:  0 how much to take 0 when to take this         Icon medications to change how you take   ramipril 5 MG capsule Commonly known as: ALTACE Take 1 capsule (5 mg total) by mouth daily. What changed:  0 medication strength 0 how much to take          tamsulosin 0.4 MG Caps capsule Commonly known as: FLOMAX Take 0.4 mg by mouth at bedtime.             Relevant Imaging Results:  Relevant Lab Results:   Additional Information [phone]  Ida Rogue, [place]

## 2020-11-27 NOTE — Discharge Summary (Addendum)
Physician Discharge Summary  Troy BONSIGNORE Sr. XBM:[id number] DOB: Dec 26, 1924 DOA: 11/19/2020  PCP: Sunday Spillers, NP  Admit date: 11/19/2020 Discharge date: 11/27/2020  Admitted From: memory care unit  Discharge disposition: memory care unit with hospice  Recommendations for Outpatient Follow-Up:   . Follow up with your primary care provider at the skilled nursing facility in 3 to 5 days. . Continue with hospice level of care.  If Foley catheter removal desired please consider voiding trial in 1 week.  Discharge Diagnosis:   Principal Problem:   AKI (acute kidney injury) (HCC) Active Problems:   Dementia (HCC)   Essential hypertension   Hyperkalemia   Leucocytosis   Dehydration   Diarrhea   Hypokalemia   Discharge Condition: Improved.  Diet recommendation: Full liquid, nectar thick consistency  Wound care: None.  Code status:DNR   History of Present Illness:   Patient is a 87 year old male with past medical history of advanced Alzheimer's dementia in memory care unit, depression, hypertension presented to the hospital brought in by her family with worsening mental status.  At baseline, patient is mostly wheelchair-bound.  In the ED, patient was noted to be severely dehydrated with acute kidney injury and was oliguric.  There was also concern for possible aspiration.  Patient was then admitted to hospital for further evaluation and treatment.   Hospital Course:   Following conditions were addressed during hospitalization as listed below,  Acute kidney injury Presented with creatinine of 3.8.  Current creatinine of 1.1.  Thought to be secondary to volume depletion, has resolved at this time.  Renal ultrasound with cysts.  Plan to discontinue hydrochlorothiazide on discharge.  Patient will need to be encouraged on oral intake.  Volume depletion. Initially received IV fluid hydration.  Improved.  Encourage oral hydration.  Hyperkalemia followed by  hypokalemia Potassium was 3.1 today.  Has been given 20 mEq of IV potassium and 40 mEq of p.o. potassium today.  Anemia of chronic disease.    Latest hemoglobin of 11.7 and is stable.    Severe Alzheimer's dementia Celexa has been decreased to 5 mg as per family who want it to be weaned off.  Seroquel decreased to 12.5 mg at nighttime.  Patient has issues with dysphagia secondary to dementia.  Speech therapy saw the patient during hospitalization.  Currently on full liquids with nectar thick consistency fluid. Palliative care on board and recommended skilled nursing facility with hospice.  Essential hypertension Patient was volume depleted on presentation, has been taken off hydrochlorothiazide.  We will continue ramipril, Coreg and amlodipine on discharge.  Ramipril dose has been decreased to 5 mg daily.  Leukocytosis Resolved.  Likely reactive.  hypomagnesemia Replenished.  We will continue magnesium on discharge.  Hypophosphatemia.    Replenished and improved  Sneezing, congestion.   Added nasal spray.  Continue aspiration precautions.  Continue bronchodilator on discharge   Dysphagia likely secondary to dementia. On full liquids with nectar thick consistency diet.  High risk of aspiration due to advanced dementia.  Acute urinary retention.  Likely secondary to BPH.  Patient was on a Foley catheter for few days and voiding trial was obtained but he failed following trial.  Added finasteride.  Continue tamsulosin.  Patient will be continued on Foley catheter for hospice level of care.  Goals of care. Palliative care recommended return to the facility with hospice.  Disposition.  At this time, patient is stable for disposition to assisted living facility with hospice care.  Spoke with  the patient's daughter Ms. [name] on the phone prior to discharge.  Medical Consultants:    Palliative care  Procedures:    None Subjective:   Today, patient examined at bedside.   Poor historian.  Intermittently smiling.  Not in obvious distress  Discharge Exam:   Vitals:   11/27/20 0800 11/27/20 0910  BP: (!) 151/111 (!) 158/98  Pulse: 71   Resp:    Temp:    SpO2:     Vitals:   11/26/20 1627 11/26/20 2106 11/27/20 0800 11/27/20 0910  BP: (!) 158/103 135/90 (!) 151/111 (!) 158/98  Pulse: 68 76 71   Resp:  20    Temp:  98 F (36.7 C)    TempSrc:      SpO2:  96%    Weight:      Height:        General: Alert awake, not in obvious distress, intermittently smiling, verbalizing some, disoriented, chronically ill and deconditioned HENT: pupils equally reacting to light,  No scleral pallor or icterus noted. Oral mucosa is moist.  Chest:   Diminished breath sounds bilaterally.  Coarse breath sounds heard CVS: S1 &S2 heard. No murmur.  Regular rate and rhythm. Abdomen: Soft, nontender, nondistended.  Bowel sounds are heard.   Extremities: No cyanosis, clubbing or edema.  Peripheral pulses are palpable. Psych: Alert, awake but disoriented, has advanced dementia, CNS:  No cranial nerve deficits.  Power equal in all extremities.   Skin: Warm and dry.  No rashes noted.  The results of significant diagnostics from this hospitalization (including imaging, microbiology, ancillary and laboratory) are listed below for reference.     Diagnostic Studies:   CT Head Wo Contrast  Result Date: 11/19/2020 CLINICAL DATA:  Altered mental status EXAM: CT HEAD WITHOUT CONTRAST TECHNIQUE: Contiguous axial images were obtained from the base of the skull through the vertex without intravenous contrast. COMPARISON:  None. FINDINGS: Brain: No evidence of acute infarction, hemorrhage, hydrocephalus, extra-axial collection or mass lesion/mass effect. Chronic atrophic and ischemic changes are noted. Vascular: No hyperdense vessel or unexpected calcification. Skull: Normal. Negative for fracture or focal lesion. Sinuses/Orbits: No acute finding. Other: None IMPRESSION: Chronic atrophic and  ischemic changes are noted. Electronically Signed   By: Alcide Clever M.D.   On: 11/19/2020 22:04   US RENAL  Result Date: 11/20/2020 CLINICAL DATA:  Acute renal injury EXAM: RENAL / URINARY TRACT ULTRASOUND COMPLETE COMPARISON:  CT from 05/19/2019 FINDINGS: Right Kidney: Renal measurements: 10.9 x 4.6 x 5.1 cm. = volume: 135 mL. No mass lesion or hydronephrosis is noted. Cysts are seen within the right kidney the largest of which measures 2.9 cm. These are roughly stable from the prior CT examination. Left Kidney: Renal measurements: 11.4 x 6.2 x 6.0 cm. = volume: 224 mL. Multiple cysts are noted similar to that seen on the right. The largest of these measures 4.2 cm in the midportion of the left kidney. No mass lesion or hydronephrosis is noted. Bladder: Decompressed by Foley catheter. Other: None. IMPRESSION: Bilateral simple appearing renal cysts. No other focal abnormality is noted. Electronically Signed   By: Alcide Clever M.D.   On: 11/20/2020 20:02   DG Chest Port 1 View  Result Date: 11/19/2020 CLINICAL DATA:  Lethargy EXAM: PORTABLE CHEST 1 VIEW COMPARISON:  01/28/2019 FINDINGS: Lung volumes are small, but are symmetric and are clear. The chin overlies the lung apices, however, no large pneumothorax is identified. No pleural effusion. Cardiac size is within normal limits when accounting for  poor pulmonary insufflation. Tortuosity of the thoracic aorta is likely accentuated by poor insufflation. The pulmonary vascularity is normal. IMPRESSION: Pulmonary hypoinflation. Electronically Signed   By: Helyn NumbersAshesh  Parikh MD   On: 11/19/2020 19:52     Labs:   Basic Metabolic Panel: Recent Labs  Lab 11/21/20 0550 11/22/20 [id number] 11/23/20 0518 11/24/20 0517 11/25/20 0747 11/27/20 0556  NA 140 143 144 144 142 142  K 3.0* 3.3* 3.6 3.8 3.8 3.1*  CL 108 112* 112* 110 109 106  CO2 25 27 24 27 24 28   GLUCOSE 89 97 112* 95 90 105*  BUN 40* 26* 21 25* 20 16  CREATININE 1.60* 1.20 1.20 1.38* 1.11 0.94   CALCIUM 8.1* 8.3* 8.9 8.7* 8.7* 8.7*  MG 1.5* 2.1 1.7 1.7 1.5*  --   PHOS  --  2.2* 2.0* 3.4 2.7  --    GFR Estimated Creatinine Clearance: 44.3 mL/min (by C-G formula based on SCr of 0.94 mg/dL). Liver Function Tests: Recent Labs  Lab 11/21/20 0550 11/22/20 0517  AST 18  --   ALT 14  --   ALKPHOS 32*  --   BILITOT 0.9  --   PROT 5.1*  --   ALBUMIN 2.7* 2.6*   No results for input(s): LIPASE, AMYLASE in the last 168 hours. No results for input(s): AMMONIA in the last 168 hours. Coagulation profile No results for input(s): INR, PROTIME in the last 168 hours.  CBC: Recent Labs  Lab 11/21/20 0550 11/22/20 0517 11/23/20 0518 11/24/20 0517 11/25/20 0747 11/27/20 0556  WBC 7.5 5.7 10.8* 10.0 8.2 8.0  NEUTROABS 5.6  --   --   --   --   --   HGB 10.3* 10.8* 12.6* 10.6* 11.2* 11.7*  HCT 33.1* 33.9* 39.9 33.9* 35.8* 36.9*  MCV 99.4 98.3 98.0 99.1 98.6 97.1  PLT 143* 159 186 167 186 204   Cardiac Enzymes: No results for input(s): CKTOTAL, CKMB, CKMBINDEX, TROPONINI in the last 168 hours. BNP: Invalid input(s): POCBNP CBG: No results for input(s): GLUCAP in the last 168 hours. D-Dimer No results for input(s): DDIMER in the last 72 hours. Hgb A1c No results for input(s): HGBA1C in the last 72 hours. Lipid Profile No results for input(s): CHOL, HDL, LDLCALC, TRIG, CHOLHDL, LDLDIRECT in the last 72 hours. Thyroid function studies No results for input(s): TSH, T4TOTAL, T3FREE, THYROIDAB in the last 72 hours.  Invalid input(s): FREET3 Anemia work up No results for input(s): VITAMINB12, FOLATE, FERRITIN, TIBC, IRON, RETICCTPCT in the last 72 hours. Microbiology Recent Results (from the past 240 hour(s))  Resp Panel by RT-PCR (Flu A&B, Covid) Nasopharyngeal Swab     Status: None   Collection Time: 11/19/20  8:22 PM   Specimen: Nasopharyngeal Swab; Nasopharyngeal(NP) swabs in vial transport medium  Result Value Ref Range Status   SARS Coronavirus 2 by RT PCR NEGATIVE  NEGATIVE Final    Comment: (NOTE) SARS-CoV-2 target nucleic acids are NOT DETECTED.  The SARS-CoV-2 RNA is generally detectable in upper respiratory specimens during the acute phase of infection. The lowest concentration of SARS-CoV-2 viral copies this assay can detect is 138 copies/mL. A negative result does not preclude SARS-Cov-2 infection and should not be used as the sole basis for treatment or other patient management decisions. A negative result may occur with  improper specimen collection/handling, submission of specimen other than nasopharyngeal swab, presence of viral mutation(s) within the areas targeted by this assay, and inadequate number of viral copies(<138 copies/mL). A negative result must  be combined with clinical observations, patient history, and epidemiological information. The expected result is Negative.  Fact Sheet for Patients:  BloggerCourse.com  Fact Sheet for Healthcare Providers:  SeriousBroker.it  This test is no t yet approved or cleared by the [place] FDA and  has been authorized for detection and/or diagnosis of SARS-CoV-2 by FDA under an Emergency Use Authorization (EUA). This EUA will remain  in effect (meaning this test can be used) for the duration of the COVID-19 declaration under Section 564(b)(1) of the Act, 21 U.S.C.section 360bbb-3(b)(1), unless the authorization is terminated  or revoked sooner.       Influenza A by PCR NEGATIVE NEGATIVE Final   Influenza B by PCR NEGATIVE NEGATIVE Final    Comment: (NOTE) The Xpert Xpress SARS-CoV-2/FLU/RSV plus assay is intended as an aid in the diagnosis of influenza from Nasopharyngeal swab specimens and should not be used as a sole basis for treatment. Nasal washings and aspirates are unacceptable for Xpert Xpress SARS-CoV-2/FLU/RSV testing.  Fact Sheet for Patients: BloggerCourse.com  Fact Sheet for Healthcare  Providers: SeriousBroker.it  This test is not yet approved or cleared by the [place] FDA and has been authorized for detection and/or diagnosis of SARS-CoV-2 by FDA under an Emergency Use Authorization (EUA). This EUA will remain in effect (meaning this test can be used) for the duration of the COVID-19 declaration under Section 564(b)(1) of the Act, 21 U.S.C. section 360bbb-3(b)(1), unless the authorization is terminated or revoked.  Performed at [facility], [address], [place], [place] [address]      Discharge Instructions:   Discharge Instructions    Diet full liquid   Complete by: As directed    Discharge instructions   Complete by: As directed    Follow-up with primary care provider at the memory care unit.  Continue hospice care at the facility.   Increase activity slowly   Complete by: As directed      Allergies as of 11/27/2020   No Known Allergies     Medication List    STOP taking these medications   BAZA PROTECT EX   hydrochlorothiazide 12.5 MG tablet Commonly known as: HYDRODIURIL   simvastatin 10 MG tablet Commonly known as: ZOCOR     TAKE these medications   acetaminophen 500 MG tablet Commonly known as: TYLENOL Take 500 mg by mouth 3 (three) times daily.   acetaminophen 500 MG tablet Commonly known as: TYLENOL Take 500 mg by mouth every 6 (six) hours as needed (pain).   albuterol (2.5 MG/3ML) 0.083% nebulizer solution Commonly known as: PROVENTIL Take 3 mLs (2.5 mg total) by nebulization every 6 (six) hours as needed for wheezing or shortness of breath.   amLODipine 5 MG tablet Commonly known as: NORVASC Take 1 tablet (5 mg total) by mouth daily. Start taking on: November 28, 2020   carvedilol 6.25 MG tablet Commonly known as: COREG Take 6.25 mg by mouth 2 (two) times daily.   citalopram 10 MG tablet Commonly known as: CELEXA Take 0.5 tablets (5 mg total) by mouth daily. What  changed: how much to take   finasteride 5 MG tablet Commonly known as: PROSCAR Take 1 tablet (5 mg total) by mouth daily. Start taking on: November 28, 2020   fluticasone 50 MCG/ACT nasal spray Commonly known as: FLONASE Place 1 spray into both nostrils daily. Start taking on: November 28, 2020   guaiFENesin-dextromethorphan 100-10 MG/5ML syrup Commonly known as: ROBITUSSIN DM Take 5 mLs by mouth every  4 (four) hours as needed for cough.   ipratropium 0.03 % nasal spray Commonly known as: ATROVENT Place 2 sprays into both nostrils 2 (two) times daily.   magnesium oxide 400 (241.3 Mg) MG tablet Commonly known as: MAG-OX Take 1 tablet (400 mg total) by mouth 2 (two) times daily.   melatonin 3 MG Tabs tablet Take 3 mg by mouth at bedtime.   polyethylene glycol 17 g packet Commonly known as: MIRALAX / GLYCOLAX Take 17 g by mouth daily as needed for moderate constipation.   QUEtiapine 25 MG tablet Commonly known as: SEROQUEL Take 0.5 tablets (12.5 mg total) by mouth at bedtime.   ramipril 5 MG capsule Commonly known as: ALTACE Take 1 capsule (5 mg total) by mouth daily. What changed:   medication strength  how much to take   tamsulosin 0.4 MG Caps capsule Commonly known as: FLOMAX Take 0.4 mg by mouth at bedtime.         Time coordinating discharge: 39 minutes  Signed:  Sherlyne Crownover  [facility] 11/27/2020, 11:23 AM

## 2020-11-27 NOTE — Progress Notes (Signed)
Notified by central telemetry that patient was going from bradycardia to tachycardia on the monitor. Full set of vitals obtained. EKG in process. Dr. Tyson Babinski made aware.

## 2020-11-27 NOTE — Progress Notes (Signed)
Nutrition Follow-up  DOCUMENTATION CODES:  Not applicable  INTERVENTION:  Recommend continuing 1:1 feeding assistance at meals.  D/C Ensure Enlive.  Add Mighty Shake II TID with meals, each supplement provides 480-500 kcals and 20-23 grams of protein  Continue Magic cup TID with meals, each supplement provides 290 kcal and 9 grams of protein.  Continue MVI with minerals daily.  NUTRITION DIAGNOSIS:  Inadequate oral intake related to lethargy/confusion as evidenced by per patient/family report (MD note). - ongoing  GOAL:  Patient will meet greater than or equal to 90% of their needs - not meeting  MONITOR:  PO intake,Supplement acceptance,Diet advancement,Labs,Weight trends,I & O's  REASON FOR ASSESSMENT:  Malnutrition Screening Tool    ASSESSMENT:  87 yo male with a PMH of advanced Alzheimer's dementia who lives in the [facility], depression, degenerative disc disease, and HTN who presents with worsening AMS and AKI. Goals of care working toward comfort care. Likely going back to [facility] with hospice. 4/22 - Dys 1 with nectar thick liquids per SLP  Pt on full liquids, likely d/t altered mental status 2/2 dementia. Pt unable to answer questions given sleepiness and AMS. Over the weekend, pt ate 0-50% of meals per documentation.  Given pt is on nectar thickened liquids, recommend discontinuing Ensure Enlive and adding Mighty Shakes given the consistency is nectar thick versus thin with Ensure.   Recommend continuing Magic Cup TID and adding Might Shakes TID to promote intake. Also recommend continuing MVI with minerals daily and 1:1 feeding assistance with meals and supplements.  Medications: Dulcolax, Mag-Ox, MVI with minerals, miralax Labs: reviewed; K 3.1, Glucose 105  Diet Order:   Diet Order            Diet full liquid Room service appropriate? Yes; Fluid consistency: Nectar Thick  Diet effective now                EDUCATION NEEDS:   Education needs have been addressed  Skin:  Skin Assessment: Reviewed RN Assessment (Dry skin)  Last BM:  11/23/20  Height:  Ht Readings from Last 1 Encounters:  11/19/20 5\' 6"  (1.676 m)   Weight:  Wt Readings from Last 1 Encounters:  11/19/20 74.6 kg   Ideal Body Weight:  64.5 kg  BMI:  Body mass index is 26.55 kg/m.  Estimated Nutritional Needs:  Kcal:  1550-1750 Protein:  80-95 grams Fluid:  >1.5 L  11/21/20, RD, LDN Registered Dietitian After Hours/Weekend Pager # in [place]

## 2020-11-27 NOTE — Progress Notes (Signed)
Civil engineer, contracting [facility])  Hospital liaison spoke with pt's daughter Rayfield Citizen , to continue education related to hospice philosophy and services and to answer any questions at this time. Rayfield Citizen questioned whether pt would have voiding trial?    [place]ase send signed and completed DNR home with pt/family.  Please provide prescriptions at discharge as needed to ensure ongoing symptom management until pt can be admitted onto hospice services.    Hospital bed ordered for delivery today. Address has been verified and is correct in the chart.  [facility] information and contact numbers given to [place].  Above information shared with Judeen Hammans Manager.  Please call with any questions or concerns.  Thank you for the opportunity to participate in this pt's care.  Gillian Scarce, BSN, RN [employer] [phone] (24h on call)

## 2020-11-27 NOTE — Progress Notes (Signed)
Central telemetry called and noted that patient was switching between bradycardia and tachycardia. V/s obtained, in chart. EKG was obtained with 3 different readings, placed in chart. Patient is asymptomatic. MD reviewed and is aware.

## 2020-11-27 NOTE — TOC Transition Note (Signed)
Transition of Care [facility]) - CM/SW Discharge Note   Patient Details  Name: Troy ARMENDARIZ Sr. MRN: [id number] Date of Birth: January 19, 1925  Transition of Care [facility]) CM/SW Contact:  Ida Rogue, LCSW Phone Number: 11/27/2020, 2:15 PM   Clinical Narrative:   Patient who is stable for discharge will return to [facility] with the support of [facility] care.  Hospital bed is in place. FL2 and d/c summary faxed to Michael at [phone].  Nursing, please call report to Casimiro Needle or his surrogate at [phone]. TOC sign off.    Final next level of care: Home w Hospice Care Barriers to Discharge: Barriers Resolved   Patient Goals and CMS Choice        Discharge Placement                       Discharge Plan and Services   Discharge Planning Services: CM Consult                                 Social Determinants of Health (SDOH) Interventions     Readmission Risk Interventions No flowsheet data found.

## 2021-01-03 DEATH — deceased

## 2022-05-25 IMAGING — DX DG CHEST 1V PORT
1 series · 1 of 1 positions shown · non-contrast
Comparison: 01/28/2019

CLINICAL DATA: Lethargy

EXAM:
PORTABLE CHEST 1 VIEW

[chest ap]
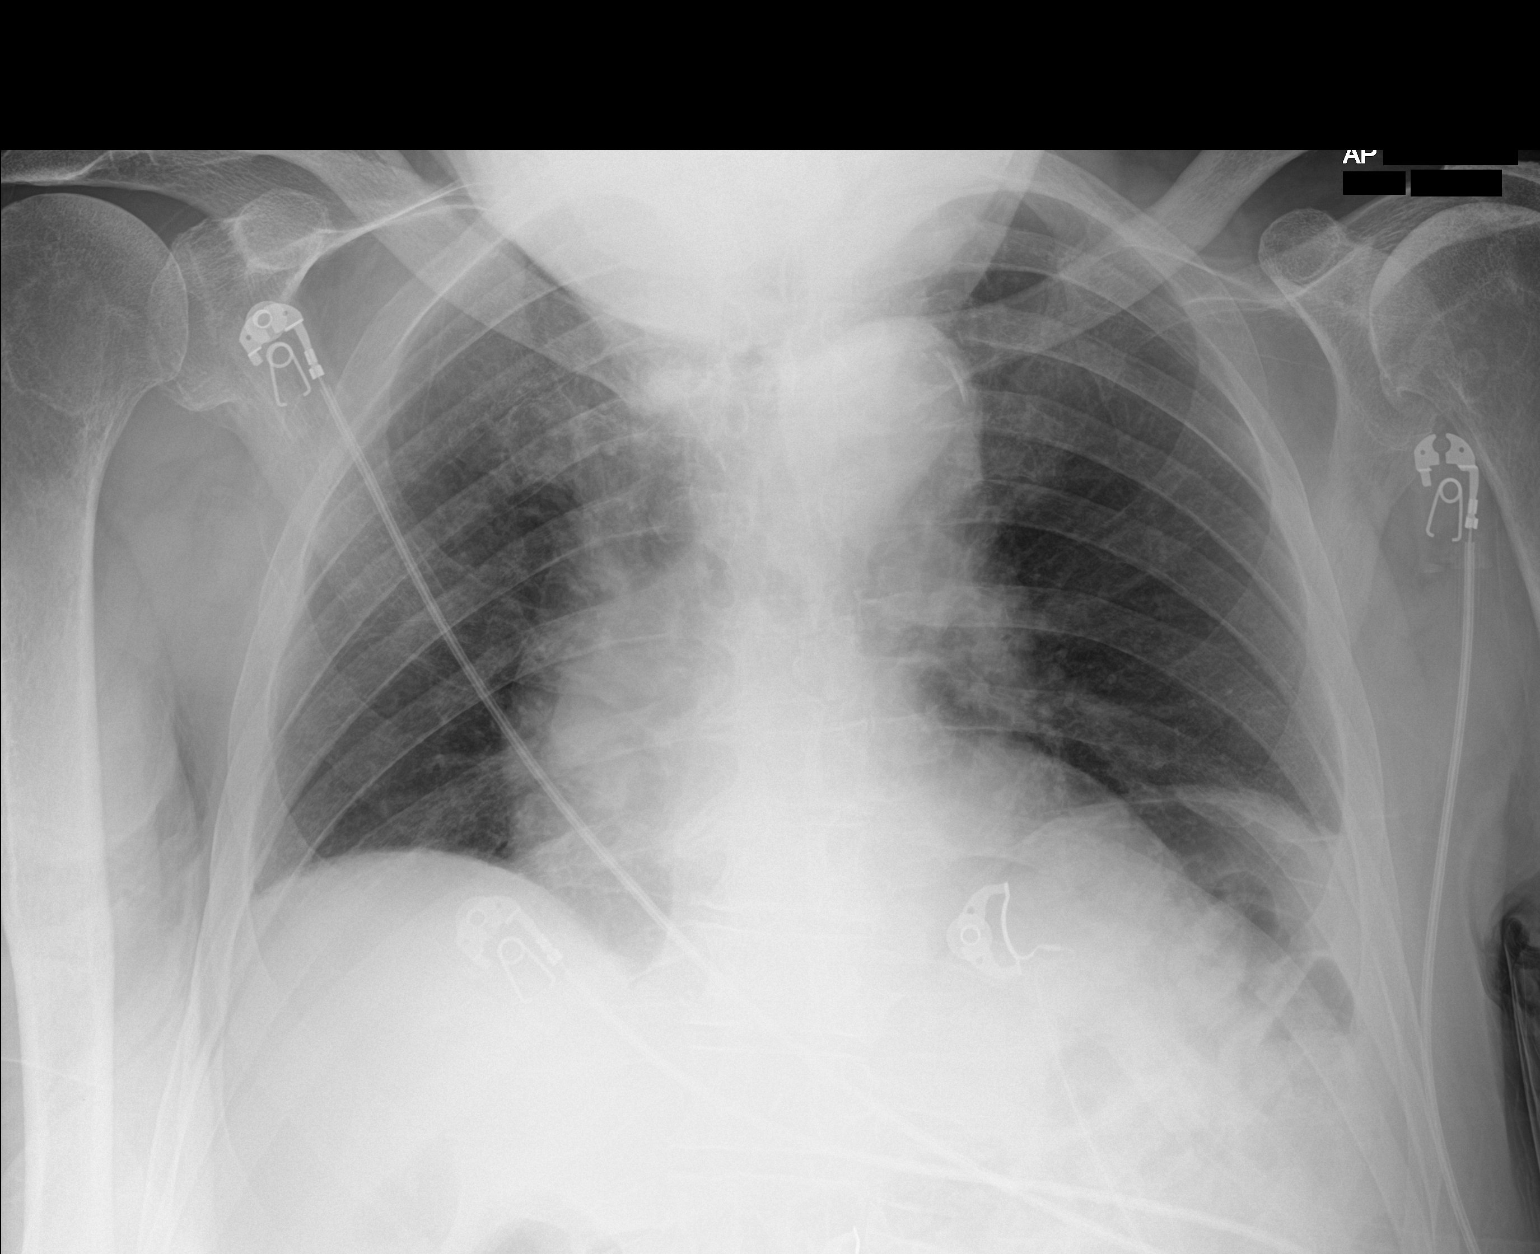

[1 of 1 positions shown; findings below may reference images not displayed]

FINDINGS: Lung volumes are small, but are symmetric and are clear. The chin
overlies the lung apices, however, no large pneumothorax is
identified. No pleural effusion. Cardiac size is within normal
limits when accounting for poor pulmonary insufflation. Tortuosity
of the thoracic aorta is likely accentuated by poor insufflation.
The pulmonary vascularity is normal.
IMPRESSION: Pulmonary hypoinflation.

## 2022-05-26 IMAGING — US US RENAL
1 series · 14 of 25 positions shown · non-contrast
Comparison: CT from 05/19/2019

CLINICAL DATA: Acute renal injury

EXAM:
RENAL / URINARY TRACT ULTRASOUND COMPLETE

[Series 1: us renal · 14 of 40 slices shown]
[im 1/40]
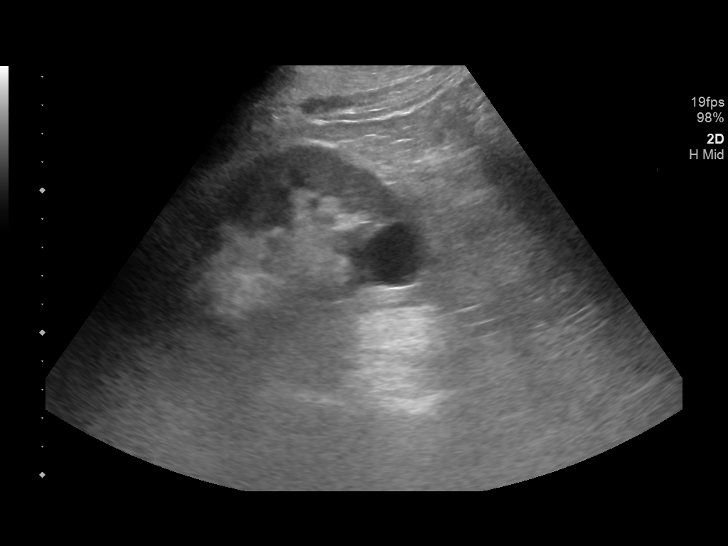
[im 4/40]
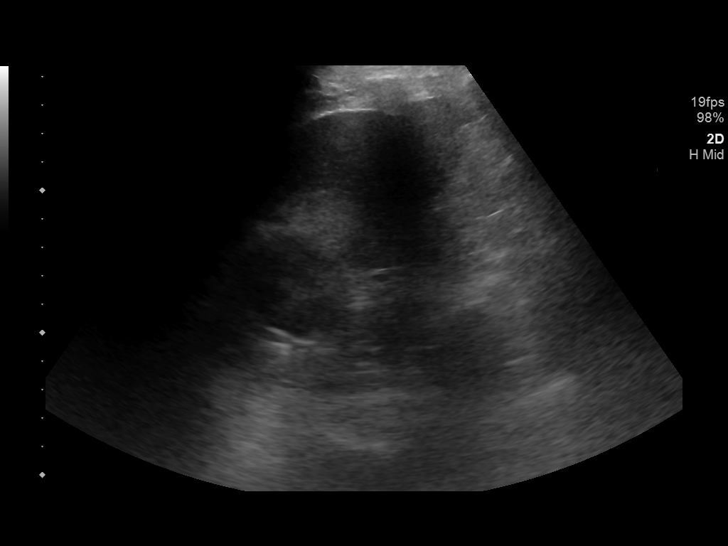
[im 7/40]
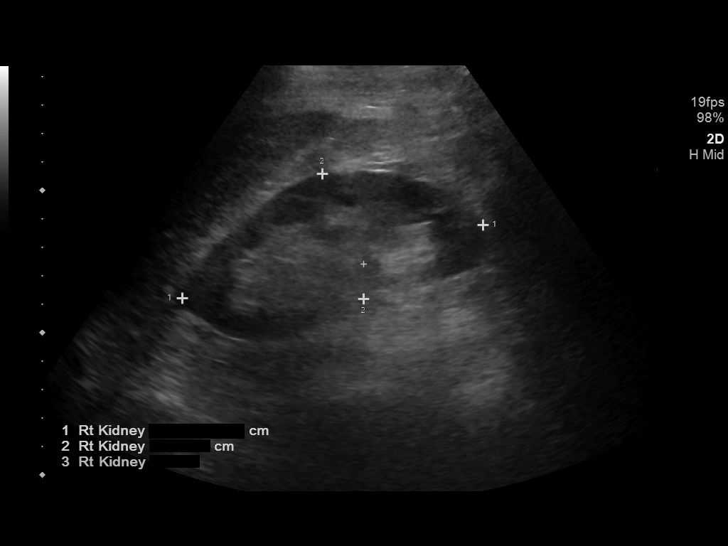
[im 10/40]
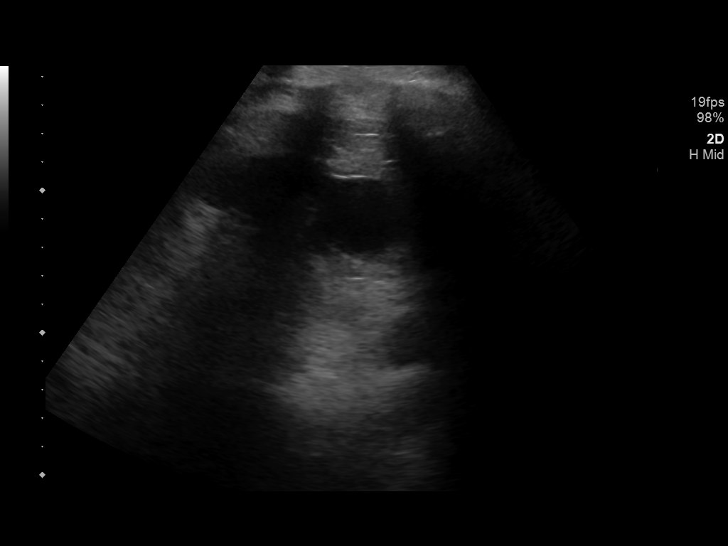
[im 14/40]
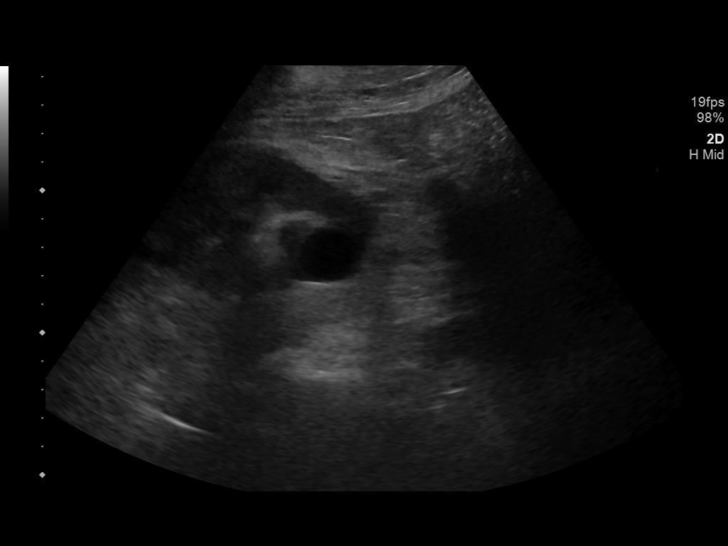
[im 15/40]
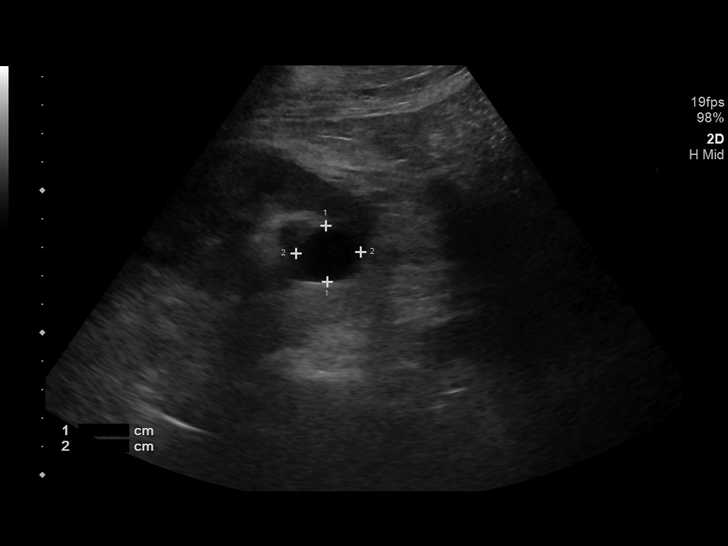
[im 18/40]
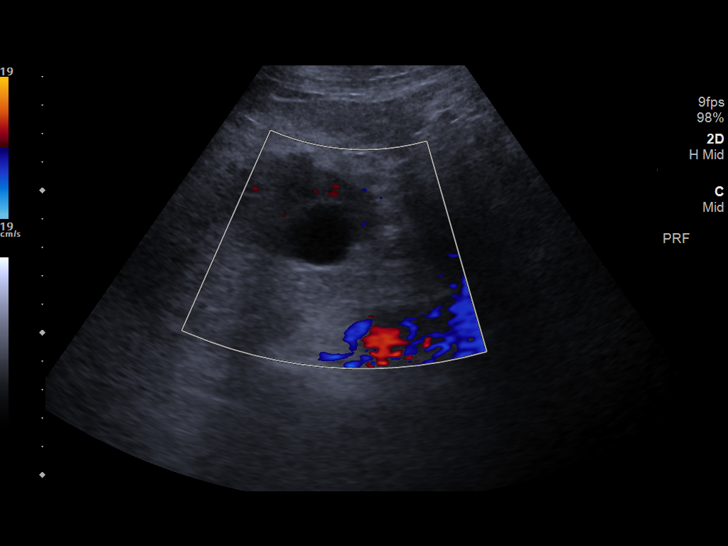
[im 22/40]
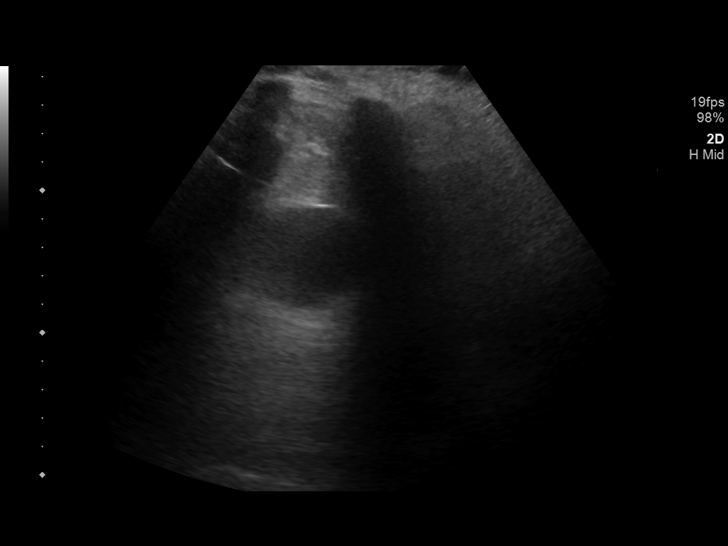
[im 25/40]
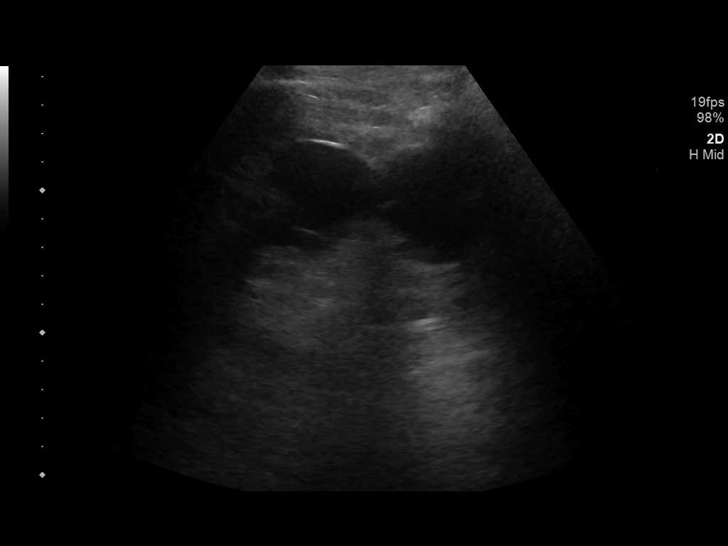
[im 27/40]
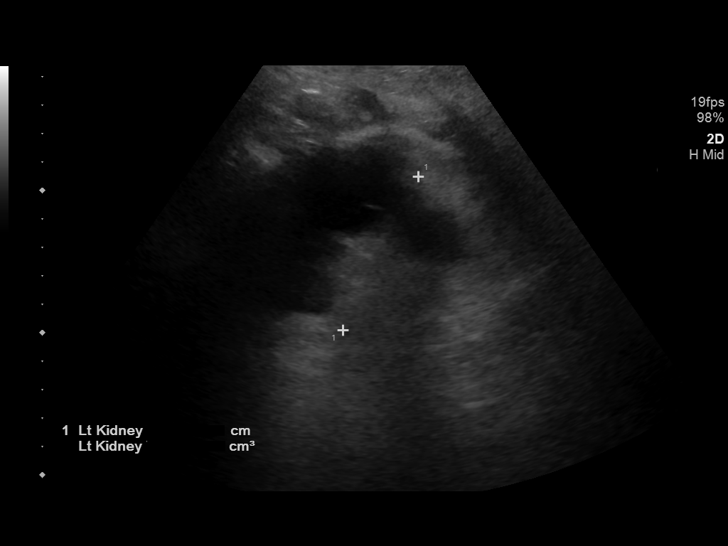
[im 30/40]
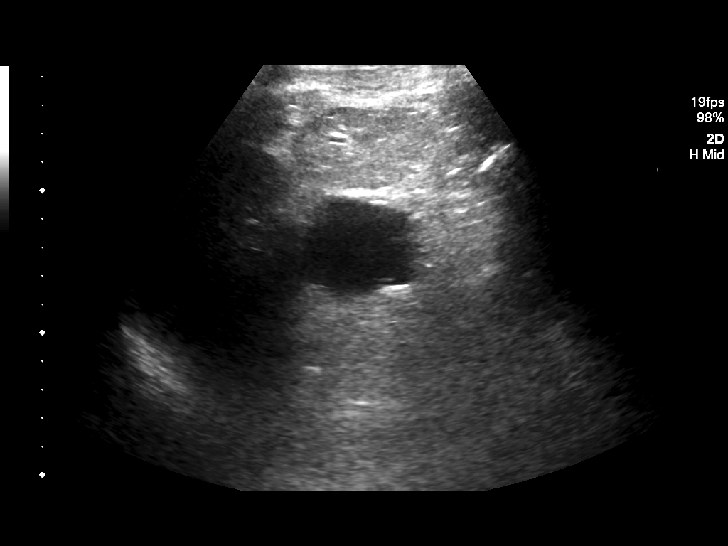
[im 33/40]
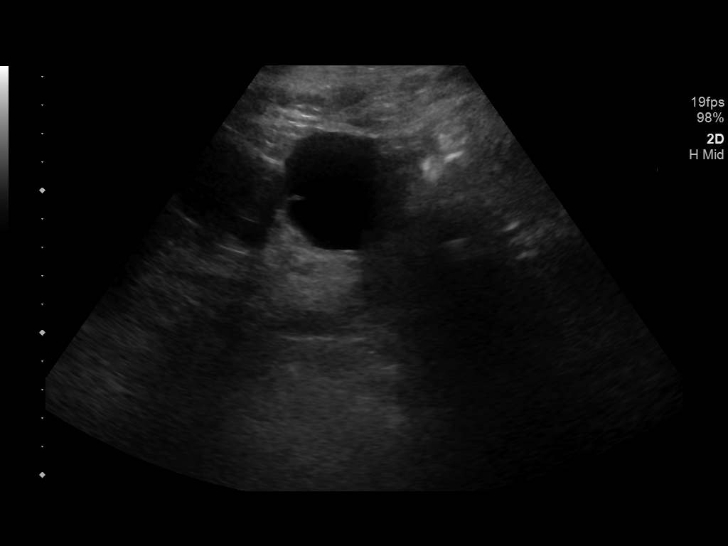
[im 36/40]
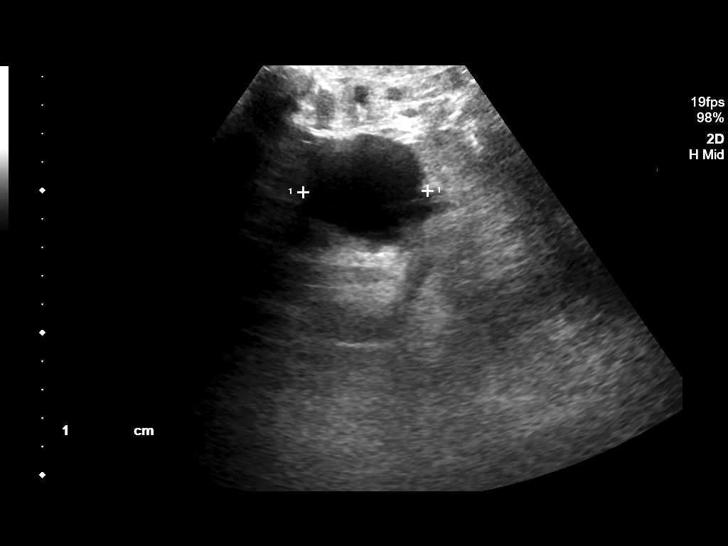
[im 40/40]
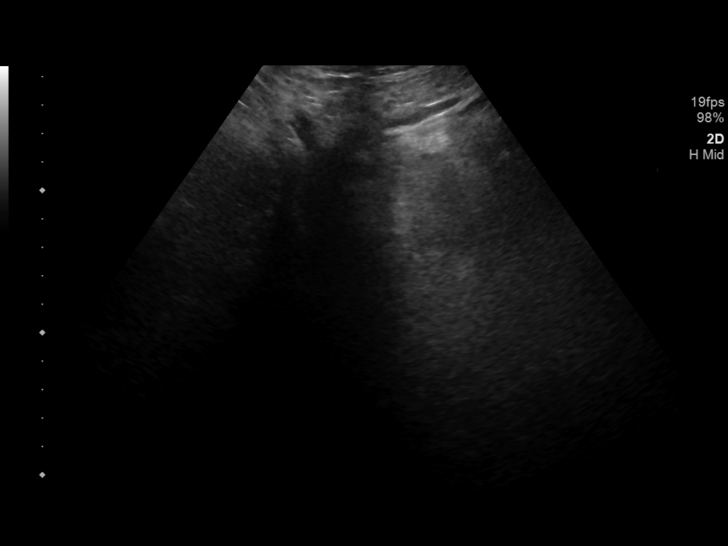

[14 of 25 positions shown; findings below may reference images not displayed]

FINDINGS: Right Kidney:

Renal measurements: 10.9 x 4.6 x 5.1 cm. = volume: 135 mL. No mass
lesion or hydronephrosis is noted. Cysts are seen within the right
kidney the largest of which measures 2.9 cm. These are roughly
stable from the prior CT examination.

Left Kidney:

Renal measurements: 11.4 x 6.2 x 6.0 cm. = volume: 224 mL. Multiple
cysts are noted similar to that seen on the right. The largest of
these measures 4.2 cm in the midportion of the left kidney. No mass
lesion or hydronephrosis is noted.

Bladder:

Decompressed by Foley catheter.

Other:

None.
IMPRESSION: Bilateral simple appearing renal cysts. No other focal abnormality
is noted.
